# Patient Record
Sex: Male | Born: 1950 | Race: White | Hispanic: No | Marital: Married | Smoking: Former smoker
Health system: Southern US, Community
[De-identification: ages and names within clinical notes are randomized; demographics above are authoritative.]

## PROBLEM LIST (undated history)

## (undated) DIAGNOSIS — E785 Hyperlipidemia, unspecified: Secondary | ICD-10-CM

## (undated) DIAGNOSIS — I10 Essential (primary) hypertension: Secondary | ICD-10-CM

## (undated) HISTORY — PX: NM MYOCAR PERF WALL MOTION: HXRAD629

## (undated) HISTORY — DX: Essential (primary) hypertension: I10

## (undated) HISTORY — DX: Hyperlipidemia, unspecified: E78.5

---

## 2009-07-17 ENCOUNTER — Encounter: Admission: RE | Admit: 2009-07-17 | Discharge: 2009-07-17 | Payer: Self-pay | Admitting: Orthopedic Surgery

## 2010-10-10 ENCOUNTER — Ambulatory Visit (HOSPITAL_BASED_OUTPATIENT_CLINIC_OR_DEPARTMENT_OTHER)
Admission: RE | Admit: 2010-10-10 | Discharge: 2010-10-10 | Disposition: A | Discharge status: Home / Self Care | Payer: 59 | Source: Ambulatory Visit | Attending: Orthopedic Surgery | Admitting: Orthopedic Surgery

## 2010-10-10 DIAGNOSIS — I10 Essential (primary) hypertension: Secondary | ICD-10-CM | POA: Insufficient documentation

## 2010-10-10 DIAGNOSIS — Z01812 Encounter for preprocedural laboratory examination: Secondary | ICD-10-CM | POA: Insufficient documentation

## 2010-10-10 DIAGNOSIS — M23359 Other meniscus derangements, posterior horn of lateral meniscus, unspecified knee: Secondary | ICD-10-CM | POA: Insufficient documentation

## 2010-10-10 DIAGNOSIS — Z01810 Encounter for preprocedural cardiovascular examination: Secondary | ICD-10-CM | POA: Insufficient documentation

## 2010-10-10 DIAGNOSIS — M23329 Other meniscus derangements, posterior horn of medial meniscus, unspecified knee: Secondary | ICD-10-CM | POA: Insufficient documentation

## 2010-10-10 DIAGNOSIS — M2340 Loose body in knee, unspecified knee: Secondary | ICD-10-CM | POA: Insufficient documentation

## 2010-10-10 DIAGNOSIS — M2240 Chondromalacia patellae, unspecified knee: Secondary | ICD-10-CM | POA: Insufficient documentation

## 2010-10-17 NOTE — Op Note (Signed)
  NAME:  Anthony Orr, Anthony Orr NO.:  [id number]  MEDICAL RECORD NO.:  [id number]          PATIENT TYPE:  LOCATION:                                 FACILITY:  PHYSICIAN:  Loreta Ave, M.D.      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  PROCEDURE:  Right knee exam under anesthesia, arthroscopy, partial medial and lateral meniscectomy.  Tricompartmental chondroplasty, removal of chondral loose bodies.  SURGEON:  Loreta Ave, MD  ASSISTANT:  Genene Churn. Barry Dienes, [place]  ANESTHESIA:  General.  BLOOD LOSS:  Minimal.  SPECIMENS:  None.  COMPLICATIONS:  None.  DRESSING:  Soft compressive.  TOURNIQUET:  Not employed. PROCEDURE:  The patient brought to the operating room, placed on the operating table in supine position.  After adequate anesthesia had been obtained, leg holder applied.  Leg prepped and draped in usual sterile fashion.  Two portals, one each medial and lateral parapatellar. Arthroscope introduced.  Knee distended and inspected.  Good tracking. Some grade 2 and mild grade 3 changes peak patella treated with chondroplasty to a stable surface.  Trochlea looked good.  Cruciate ligaments intact.  Medial compartment diffuse grade 2 and 3 changes on the condyle debrided.  Marked complex tearing of the entire posterior half medial meniscus with development of small meniscal cyst on the back.  Posterior half removed tapered into remaining meniscus.  Anterior half looked good.  Laterally, there was a displaced tear off the posterior horn which was removed, tapered in smoothly.  He is still salvaging a fair amount of meniscus.  Some grade 2 changes on the plateau and  on the condyle debrided.  At completion, the entire knee examined, no other findings were appreciated.  Instruments and fluid removed.  Portals injected with Marcaine, closed with nylon.  Knee injected with Depo-Medrol and Marcaine.  Anesthesia reversed.  Brought to recovery room.  Tolerated surgery well.  No complications.     Loreta Ave, M.D.     DFM/MEDQ  D:  10/10/2010  T:  10/11/2010  Job:  [id number]  Electronically Signed by Mckinley Jewel M.D. on 10/17/2010 02:09:39 PM

## 2011-07-03 IMAGING — CT CT HEAD W/O CM
2 series · 16 of 30 positions shown, 18 images · non-contrast
Comparison: None.

CLINICAL DATA: 58-year-old male with headache status post head
trauma on 07/07/2009.

CT HEAD WITHOUT CONTRAST
TECHNIQUE: Contiguous axial images were obtained from the base of
the skull through the vertex without contrast.

[Series 2: head w/o · axial · non-contrast · 0.49mm/px · z∈[+25,+151]mm · 8 of 32 slices shown, 10 images]
[im 4/32  brain]
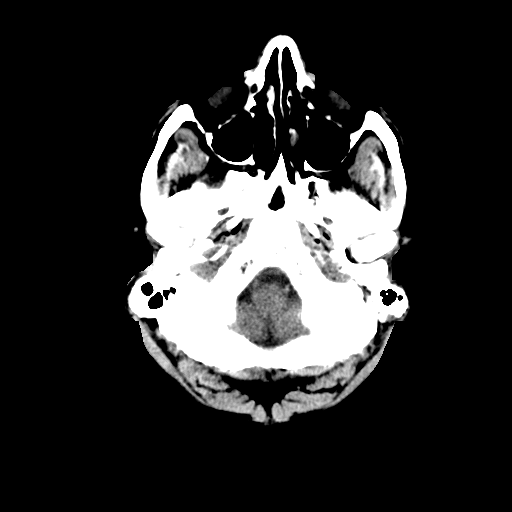
[im 4/32  bone]
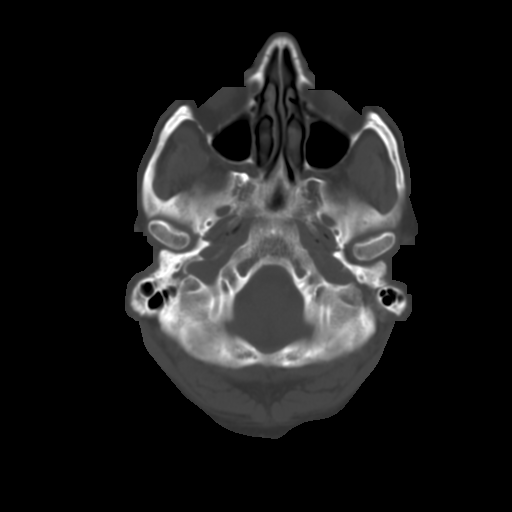
[im 7/32  brain]
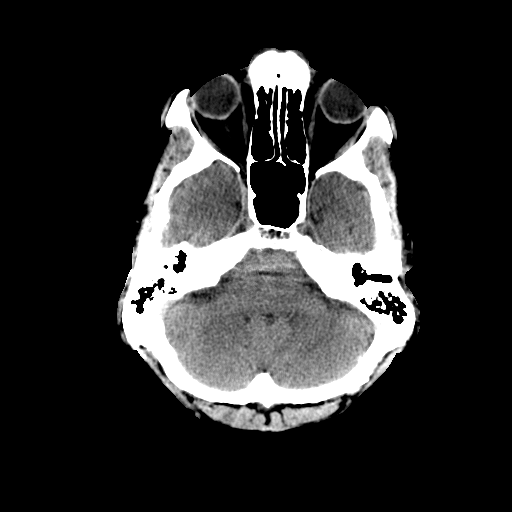
[im 11/32  brain]
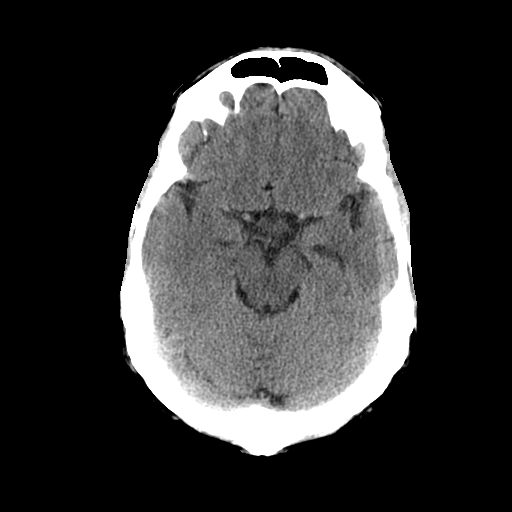
[im 14/32  brain]
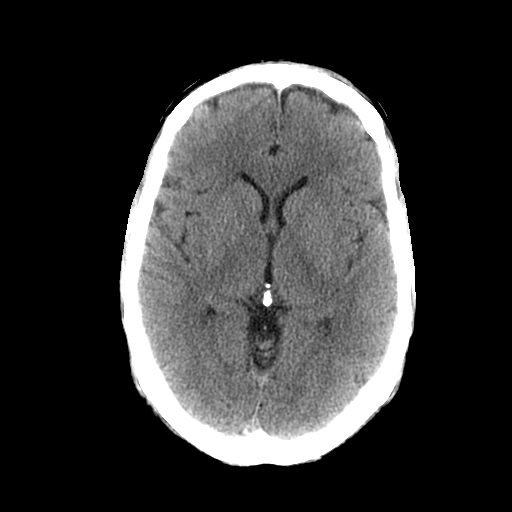
[im 18/32  brain]
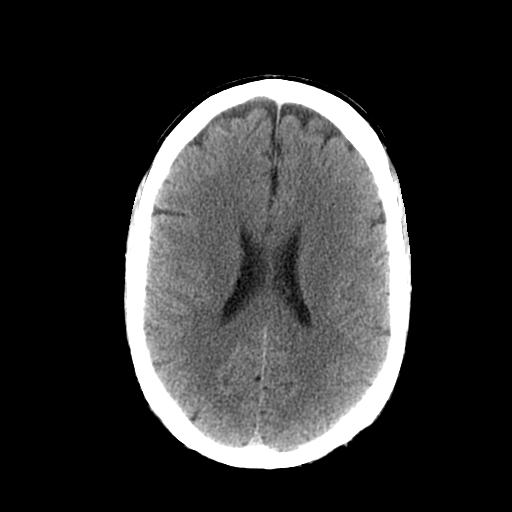
[im 18/32  bone]
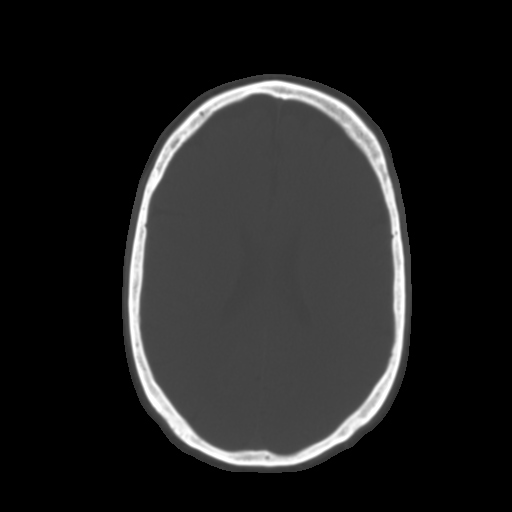
[im 21/32  brain]
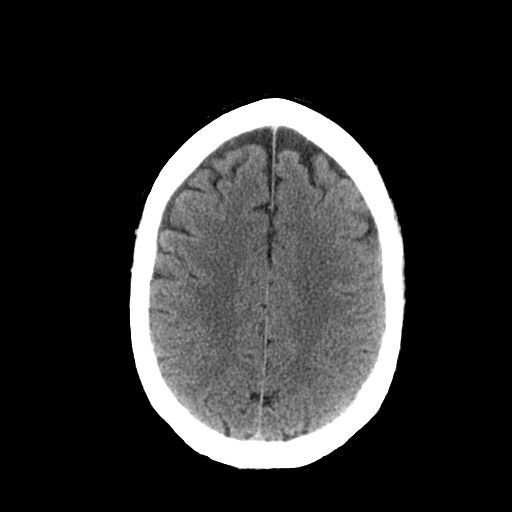
[im 25/32  brain]
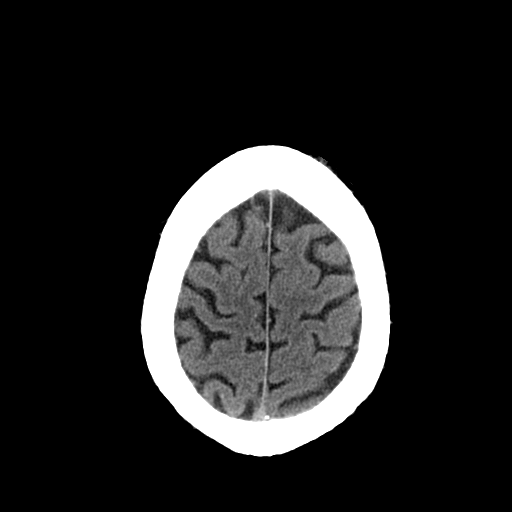
[im 28/32  brain]
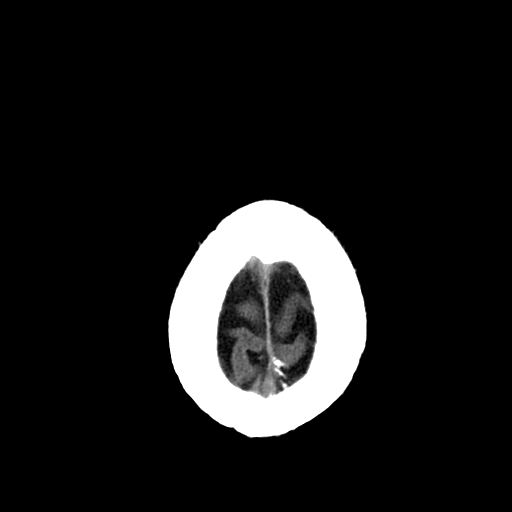

[Series 3: head bone · axial · 0.49mm/px · z∈[+24,+155]mm · 8 of 64 slices shown]
[im 7/64  bone]
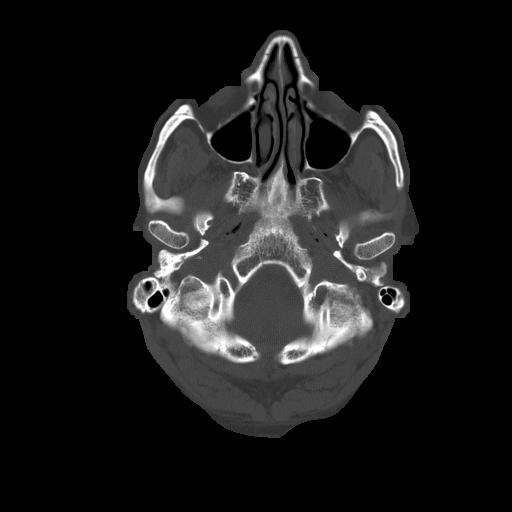
[im 14/64  bone]
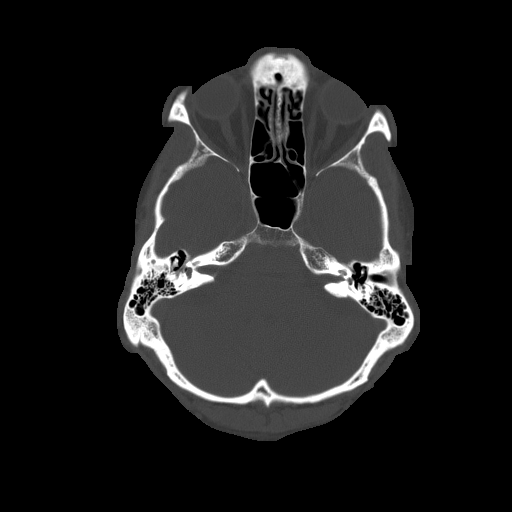
[im 20/64  bone]
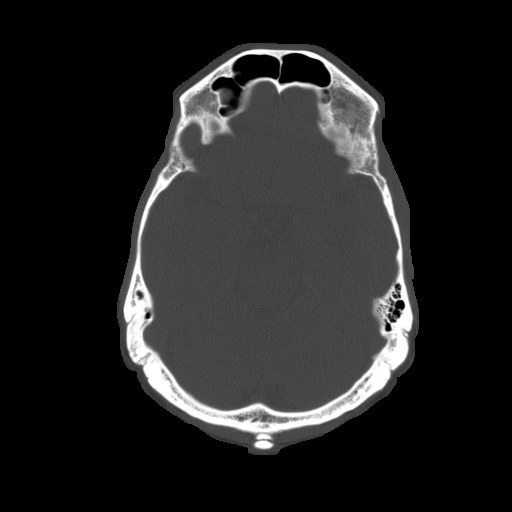
[im 27/64  bone]
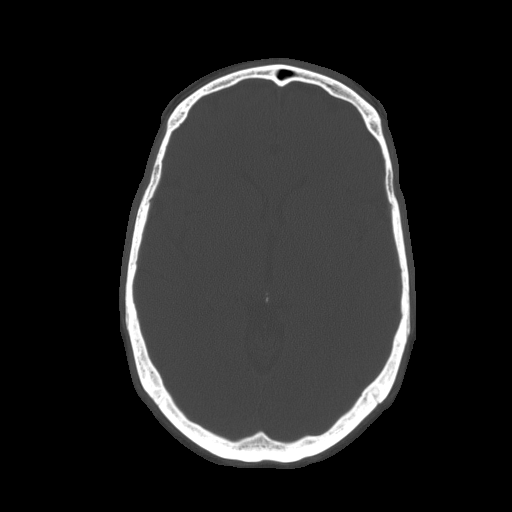
[im 37/64  bone]
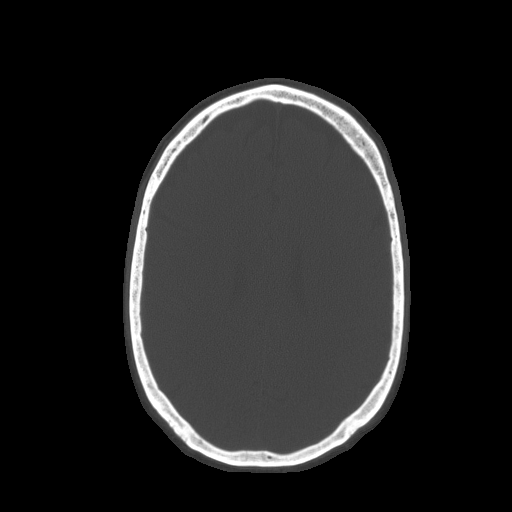
[im 44/64  bone]
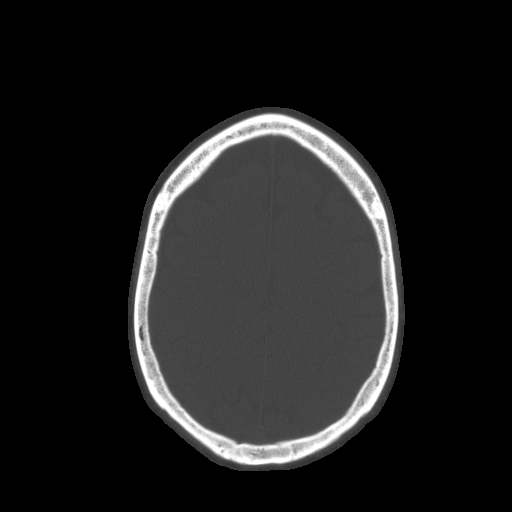
[im 50/64  bone]
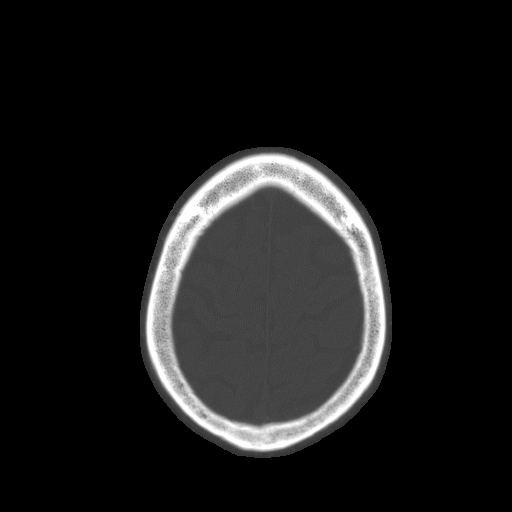
[im 57/64  bone]
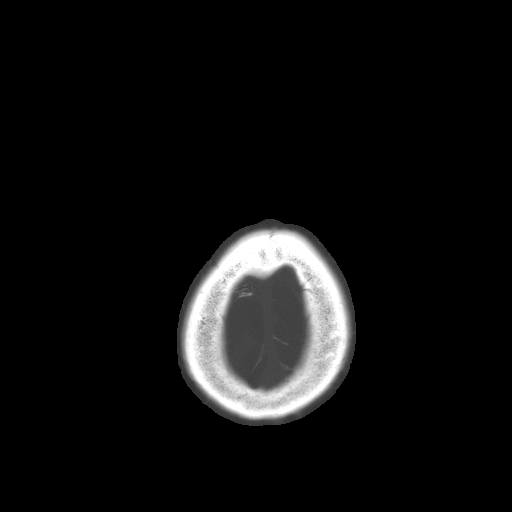

[16 of 30 positions shown; findings below may reference images not displayed]

FINDINGS: Minimal scalp irregularity anteriorly near the midline on
series 3 image [DATE] represent mild soft tissue traumatic
sequelae.  Otherwise, Visualized orbits and scalp soft tissues are
within normal limits.  No acute osseous abnormality identified.
Visualized paranasal sinuses and mastoids are clear.  Calcified
atherosclerosis at the skull base.

Cerebral volume is within normal limits for age.  No midline shift,
ventriculomegaly, mass effect, evidence of mass lesion,
intracranial hemorrhage or evidence of cortically based acute
infarction.  Gray-white matter differentiation is within normal
limits throughout the brain.  No suspicious intracranial vascular
hyperdensity.
IMPRESSION: 1. Normal noncontrast CT appearance of the brain for age.
2.  Minor anterior forehead soft tissue injury without underlying
fracture.

## 2013-01-06 ENCOUNTER — Other Ambulatory Visit: Payer: Self-pay

## 2013-01-06 MED ORDER — AMLODIPINE BESYLATE 5 MG PO TABS: 5.0000 mg | ORAL_TABLET | Freq: Every day | ORAL | Status: DC

## 2013-01-06 NOTE — Telephone Encounter (Signed)
Rx was sent to pharmacy electronically. 

## 2013-01-31 ENCOUNTER — Telehealth (HOSPITAL_COMMUNITY): Payer: Self-pay | Admitting: Cardiovascular Disease

## 2013-01-31 ENCOUNTER — Other Ambulatory Visit (HOSPITAL_COMMUNITY): Payer: Self-pay | Admitting: Cardiovascular Disease

## 2013-01-31 DIAGNOSIS — I10 Essential (primary) hypertension: Secondary | ICD-10-CM

## 2013-02-15 ENCOUNTER — Telehealth (HOSPITAL_COMMUNITY): Payer: Self-pay | Admitting: Cardiovascular Disease

## 2013-02-21 ENCOUNTER — Ambulatory Visit (INDEPENDENT_AMBULATORY_CARE_PROVIDER_SITE_OTHER): Payer: 59 | Admitting: Cardiovascular Disease

## 2013-02-21 ENCOUNTER — Encounter: Payer: Self-pay | Admitting: Cardiovascular Disease

## 2013-02-21 VITALS — BP 138/86 | HR 55 | Ht 68.0 in | Wt 191.3 lb

## 2013-02-21 DIAGNOSIS — E785 Hyperlipidemia, unspecified: Secondary | ICD-10-CM | POA: Insufficient documentation

## 2013-02-21 DIAGNOSIS — I10 Essential (primary) hypertension: Secondary | ICD-10-CM

## 2013-02-21 NOTE — Progress Notes (Signed)
Patient ID: Anthony Orr, male   DOB: 01/25/1951, 62 y.o.   MRN: [id number]     HPI: Anthony Orr, is a 61 y.o. male presents to the office today for cardiology evaluation.  Anthony Orr is now 62 years old. In 2002, he underwent a routine treadmill test and developed a short burst of wide complex tachycardia. Since that time, he has been on low dose beta blocker therapy without recurrence. He has a history of hypertension as well as hyperlipidemia. His last echo Doppler study in October 2008 showed normal systolic function but it was near mid cavity obliteration and there was a suggestion of a 10 mm pressure gradient.  Anthony Orr has remained active. He exercises fairly regularly. He skis in the winter. He denies any palpitations. We have discussed the possibility of getting an echo Doppler study this year. He apparently did not get this done. He denies shortness of breath. He denies change in exercise tolerance. He denies any chest pain per  No past medical history on file.  No past surgical history on file.  No Known Allergies  Current Outpatient Prescriptions  Medication Sig Dispense Refill  . aspirin 81 MG tablet Take 81 mg by mouth daily.      Marland Kitchen atorvastatin (LIPITOR) 40 MG tablet Take 40 mg by mouth daily.      . metoprolol succinate (TOPROL-XL) 25 MG 24 hr tablet Take 25 mg by mouth daily.      Marland Kitchen amLODipine (NORVASC) 5 MG tablet Take 1 tablet (5 mg total) by mouth daily.  30 tablet  8   No current facility-administered medications for this visit.    History   Social History  . Marital Status: Married    Spouse Name: N/A    Number of Children: N/A  . Years of Education: N/A   Occupational History  . Not on file.   Social History Main Topics  . Smoking status: Former Smoker    Types: Cigarettes  . Smokeless tobacco: Never Used     Comment: quit in 2007  . Alcohol Use: 7.0 oz/week    14 drink(s) per week  . Drug Use: Not on file  . Sexually Active: Not on  file   Other Topics Concern  . Not on file   Social History Narrative  . No narrative on file    Socially he is married and has 4 children and 6 grandchildren. He builds medical buildings. There is no tobacco use. He does drink occasional alcohol.  ROS is negative for fevers, chills or night sweats. He denies outpatient. There are no visual symptoms. He denies wheezing. He denies chest pain. He denies sleep disturbance. Denies abdominal pain per he denies myalgias or edema  Other system review is negative.  PE BP 138/86  Pulse 55  Ht 5\' 8"  (1.727 m)  Wt 191 lb 4.8 oz (86.773 kg)  BMI 29.09 kg/m2  General: Alert, oriented, no distress.  Skin: normal turgor, no rashes HEENT: Normocephalic, atraumatic. Pupils round and reactive; sclera anicteric;no lid lag.  Nose without nasal septal hypertrophy Mouth/Parynx benign; Mallinpatti scale 2 Neck: No JVD, no carotid briuts Lungs: clear to ausculatation and percussion; no wheezing or rales Heart: RRR, s1 s2 normal 1/6 systolic murmur. No S3 gallop. No S4 gallop. Abdomen: soft, nontender; no hepatosplenomehaly, BS+; abdominal aorta nontender and not dilated by palpation. Pulses 2+ Extremities: no clubbing cyanosis or edema, Homan's sign negative  Neurologic: grossly nonfocal  ECG: Sinus rhythm at 55 beats  per minute. PR interval 202 ms. No significant ST-T changes.  LABS:  BMET No results found for this basename: na, k, cl, co2, glucose, bun, creatinine, calcium, gfrnonaa, gfraa     Hepatic Function Panel  No results found for this basename: prot, albumin, ast, alt, alkphos, bilitot, bilidir, ibili     CBC    Component Value Date/Time   HGB 16.3 10/10/2010 0935     BNP No results found for this basename: probnp    Lipid Panel  No results found for this basename: chol, trig, hdl, cholhdl, vldl, ldlcalc     RADIOLOGY: No results found.    ASSESSMENT AND PLAN: Anthony Orr feels well. He does have a history of  hyperlipidemia. Prior to initiating statin therapy his total cholesterol was 228 and LDL cholesterol 177. His last assessment in January 2014 revealed total cholesterol 161 triglycerides 110 HDL 61 LDL 78. He is tolerating amlodipine as well as Toprol for blood pressure control he is asymptomatic. I recommended that in one year he undergo a followup echo Doppler study and I will see him in the office at that time. Prior to that evaluation will also undergo a complete set of laboratory.     Lennette Bihari, MD, [facility]  02/21/2013 2:46 PM

## 2013-02-21 NOTE — Patient Instructions (Addendum)
Your physician recommends that you return for lab work fasting in 1 year  Your physician recommends that you schedule a follow-up appointment in: 1 year

## 2013-02-22 ENCOUNTER — Encounter: Payer: Self-pay | Admitting: Cardiovascular Disease

## 2013-02-24 ENCOUNTER — Telehealth (HOSPITAL_COMMUNITY): Payer: Self-pay | Admitting: Cardiovascular Disease

## 2013-02-24 NOTE — Telephone Encounter (Signed)
Pt was returning my call regarding scheduling an echo that was ordered by Dr. Tresa Endo. Pt states that Dr. Tresa Endo told him that he doesn't need any testing until next year. Should I defer this order until then or schedule the echo now

## 2013-02-25 NOTE — Telephone Encounter (Signed)
Please address. Thanks!

## 2013-03-07 NOTE — Telephone Encounter (Signed)
Echo for next year

## 2013-03-07 NOTE — Telephone Encounter (Signed)
Anthony Orr just responded to this question. He was on vacation last week.

## 2013-04-21 ENCOUNTER — Telehealth (HOSPITAL_COMMUNITY): Payer: Self-pay | Admitting: *Deleted

## 2013-06-28 ENCOUNTER — Other Ambulatory Visit: Payer: Self-pay | Admitting: *Deleted

## 2013-06-28 MED ORDER — ATORVASTATIN CALCIUM 40 MG PO TABS: 40.0000 mg | ORAL_TABLET | Freq: Every day | ORAL | Status: DC

## 2013-07-21 DIAGNOSIS — K222 Esophageal obstruction: Secondary | ICD-10-CM | POA: Insufficient documentation

## 2013-07-21 DIAGNOSIS — K219 Gastro-esophageal reflux disease without esophagitis: Secondary | ICD-10-CM | POA: Insufficient documentation

## 2013-10-11 ENCOUNTER — Other Ambulatory Visit: Payer: Self-pay | Admitting: *Deleted

## 2013-10-11 MED ORDER — METOPROLOL SUCCINATE ER 25 MG PO TB24: 25.0000 mg | ORAL_TABLET | Freq: Every day | ORAL | Status: DC

## 2013-10-11 NOTE — Telephone Encounter (Signed)
Rx refill sent to patients pharmacy  

## 2014-01-24 ENCOUNTER — Other Ambulatory Visit: Payer: Self-pay

## 2014-01-24 MED ORDER — AMLODIPINE BESYLATE 5 MG PO TABS: 5.0000 mg | ORAL_TABLET | Freq: Every day | ORAL | Status: DC

## 2014-01-24 NOTE — Telephone Encounter (Signed)
Rx was sent to pharmacy electronically. 

## 2014-01-30 ENCOUNTER — Telehealth: Payer: Self-pay | Admitting: Cardiovascular Disease

## 2014-01-30 NOTE — Telephone Encounter (Signed)
Pt. Anthony SpanielSaid you were a good friend and wants you to call him about a letter for his insurance company, stating he is ok

## 2014-01-30 NOTE — Telephone Encounter (Signed)
Pt needs a letter for his insurance stating that he is all right.Please call him,he will give you more details.

## 2014-02-27 ENCOUNTER — Telehealth (HOSPITAL_COMMUNITY): Payer: Self-pay | Admitting: *Deleted

## 2014-03-02 ENCOUNTER — Telehealth (HOSPITAL_COMMUNITY): Payer: Self-pay | Admitting: *Deleted

## 2014-03-02 ENCOUNTER — Other Ambulatory Visit (HOSPITAL_COMMUNITY): Payer: Self-pay | Admitting: Cardiovascular Disease

## 2014-03-02 DIAGNOSIS — I10 Essential (primary) hypertension: Secondary | ICD-10-CM

## 2014-03-03 ENCOUNTER — Ambulatory Visit (HOSPITAL_COMMUNITY)
Admission: RE | Admit: 2014-03-03 | Discharge: 2014-03-03 | Disposition: A | Discharge status: Home / Self Care | Payer: Medicare Other | Source: Ambulatory Visit | Attending: Cardiovascular Disease | Admitting: Cardiovascular Disease

## 2014-03-03 DIAGNOSIS — I517 Cardiomegaly: Secondary | ICD-10-CM

## 2014-03-03 DIAGNOSIS — I10 Essential (primary) hypertension: Secondary | ICD-10-CM | POA: Insufficient documentation

## 2014-03-03 NOTE — Progress Notes (Signed)
2D Echocardiogram Complete.  03/03/2014   Tiye Huwe, RDCS  

## 2014-03-06 ENCOUNTER — Telehealth: Payer: Self-pay | Admitting: Cardiovascular Disease

## 2014-03-06 NOTE — Telephone Encounter (Signed)
Pt called in stating that he would not be able to make his appt on 08/26 and wants to know if he can be worked in that morning. Pt would like a call regardless to know if that can be arranged or not. Please call  Thanks

## 2014-03-06 NOTE — Telephone Encounter (Signed)
Please address this at you leisure

## 2014-03-08 ENCOUNTER — Ambulatory Visit: Payer: 59 | Admitting: Cardiovascular Disease

## 2014-03-13 NOTE — Telephone Encounter (Signed)
Closed encounter °

## 2014-03-14 ENCOUNTER — Encounter: Payer: Self-pay | Admitting: *Deleted

## 2014-04-17 ENCOUNTER — Encounter: Payer: Self-pay | Admitting: Podiatry

## 2014-04-17 ENCOUNTER — Ambulatory Visit (INDEPENDENT_AMBULATORY_CARE_PROVIDER_SITE_OTHER): Payer: Medicare Other | Admitting: Podiatry

## 2014-04-17 ENCOUNTER — Ambulatory Visit (INDEPENDENT_AMBULATORY_CARE_PROVIDER_SITE_OTHER): Payer: Medicare Other

## 2014-04-17 VITALS — BP 144/82 | HR 57 | Resp 16

## 2014-04-17 DIAGNOSIS — M205X1 Other deformities of toe(s) (acquired), right foot: Secondary | ICD-10-CM

## 2014-04-17 DIAGNOSIS — M79671 Pain in right foot: Secondary | ICD-10-CM | POA: Diagnosis not present

## 2014-04-17 NOTE — Progress Notes (Signed)
Subjective:     Patient ID: Anthony Orr, male   DOB: 1950/10/16, 63 y.o.   MRN: [id number]  HPI patient presents stating I'm getting more pain in my first metatarsal phalangeal joint right over the last year and I did have some numbness recently and I am ready to get it fixed   Review of Systems     Objective:   Physical Exam Neurovascular status intact with muscle strength adequate and range of motion subtalar midtarsal joint within normal limits. Patient's noted to have diminishment range of motion first MPJ bilateral but there is approximate 20 of motion that is pain-free with no crepitus. 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 We discussed at great length surgery and I did explain that this can take upwards of 6 months to get better but hopefully in 3 months he should be able to return to active activities. I discussed this with and he wants surgery and wants to get the right foot done and I did explain the difference between trying to repair the joint versus fusion versus joint implantation procedure he is opted for repairing the joint and I'm very hopeful that this will solve his problem even though possibility existed will require ultimate fusion or joint implantation. Today I allowed him to read consent form for a biplanar osteotomy first metatarsal right with possible subchondral bone drilling and bone spur removal. Patient read the consent form and signed after reviewing all complications and wants surgery understanding total recovery. He approximate 6 months. Dispensed air fracture walker with all instructions on usage and I wanted getting used to that prior to surgery

## 2014-04-20 ENCOUNTER — Other Ambulatory Visit: Payer: Self-pay | Admitting: *Deleted

## 2014-04-20 MED ORDER — AMLODIPINE BESYLATE 5 MG PO TABS: 5.0000 mg | ORAL_TABLET | Freq: Every day | ORAL | Status: DC

## 2014-04-20 MED ORDER — ATORVASTATIN CALCIUM 40 MG PO TABS
40.0000 mg | ORAL_TABLET | Freq: Every day | ORAL | Status: DC
Start: 2014-04-20 — End: 2014-05-10

## 2014-04-20 NOTE — Telephone Encounter (Signed)
Medication refilled electronmically with notation: Pt must keep appointment for future refills.

## 2014-05-01 ENCOUNTER — Telehealth: Payer: Self-pay | Admitting: *Deleted

## 2014-05-01 NOTE — Telephone Encounter (Signed)
I'm scheduled to have toe surgery tomorrow.  I need to talk to Dr. Charlsie Merlesegal, he knows me, it's Audry RilesJim Whetzel.  I'm a little concerned about it.

## 2014-05-08 ENCOUNTER — Other Ambulatory Visit: Payer: Self-pay | Admitting: *Deleted

## 2014-05-08 ENCOUNTER — Encounter: Payer: Medicare Other | Admitting: Podiatry

## 2014-05-08 MED ORDER — METOPROLOL SUCCINATE ER 25 MG PO TB24: 25.0000 mg | ORAL_TABLET | Freq: Every day | ORAL | Status: DC

## 2014-05-08 NOTE — Telephone Encounter (Signed)
Refilled electronically 

## 2014-05-10 ENCOUNTER — Encounter: Payer: Self-pay | Admitting: Cardiovascular Disease

## 2014-05-10 ENCOUNTER — Ambulatory Visit (INDEPENDENT_AMBULATORY_CARE_PROVIDER_SITE_OTHER): Payer: Medicare Other | Admitting: Cardiovascular Disease

## 2014-05-10 VITALS — BP 128/82 | HR 62 | Ht 68.0 in | Wt 195.0 lb

## 2014-05-10 DIAGNOSIS — I10 Essential (primary) hypertension: Secondary | ICD-10-CM

## 2014-05-10 DIAGNOSIS — E785 Hyperlipidemia, unspecified: Secondary | ICD-10-CM

## 2014-05-10 DIAGNOSIS — Z125 Encounter for screening for malignant neoplasm of prostate: Secondary | ICD-10-CM

## 2014-05-10 DIAGNOSIS — E782 Mixed hyperlipidemia: Secondary | ICD-10-CM

## 2014-05-10 MED ORDER — METOPROLOL SUCCINATE ER 25 MG PO TB24: 25.0000 mg | ORAL_TABLET | Freq: Every day | ORAL | Status: DC

## 2014-05-10 NOTE — Progress Notes (Signed)
Patient ID: Anthony Orr, male   DOB: March 06, 1951, 63 y.o.   MRN: [id number]     HPI: Anthony BeneJames F Wuthrich is a 63 y.o. male presents to the office today for a 4814 month cardiology evaluation.   In 2002, Mr. Gaynell FaceMarshall underwent a routine treadmill test and developed a short burst of wide complex tachycardia. Since that time, he has been on low dose beta blocker therapy without recurrence. He has a history of hypertension as well as hyperlipidemia. An echo Doppler study in October 2008 showed normal systolic function but suggested mild mid cavity obliteration with a 10 mm pressure gradient.  Mr. Gaynell FaceMarshall has remained active.  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 He skis in the winter. He denies any palpitations.  He states since January, he has been trying to avoid red meat.  He underwent a six-year follow-up echo Doppler study on 03/03/2014.  This showed mild LVH with normal systolic function with an ejection fraction of 55-60%.  He did not have regional wall motion abnormalities.  There was no mention of any mid cavity obstruction or gradient.  There was suggestion of mild grade 1 diastolic relaxation abnormality.  He had mild left atrial dilatation.  He denies chest pain.  He denies palpitations.  He denies change in exercise tolerance.  History reviewed. No pertinent past medical history.  History reviewed. No pertinent past surgical history.  No Known Allergies  Current Outpatient Prescriptions  Medication Sig Dispense Refill  . amLODipine (NORVASC) 5 MG tablet Take 1 tablet (5 mg total) by mouth daily. Pt must keep appointment for future refills.  30 tablet  0  . aspirin 81 MG tablet Take 81 mg by mouth daily.      Marland Kitchen. atorvastatin (LIPITOR) 40 MG tablet Take 1 tablet (40 mg total) by mouth daily. Pt must keep appointment for future refills.  30 tablet  0  . metoprolol succinate (TOPROL-XL) 25 MG 24 hr tablet Take 1 tablet (25 mg total) by mouth daily.   30 tablet  4   No current facility-administered medications for this visit.    History   Social History  . Marital Status: Married    Spouse Name: N/A    Number of Children: N/A  . Years of Education: N/A   Occupational History  . Not on file.   Social History Main Topics  . Smoking status: Former Smoker    Types: Cigarettes  . Smokeless tobacco: Never Used     Comment: quit in 2007  . Alcohol Use: 7.0 oz/week    14 drink(s) per week  . Drug Use: Not on file  . Sexual Activity: Not on file   Other Topics Concern  . Not on file   Social History Narrative  . No narrative on file    Socially he is married and has 4 children and 6 grandchildren. He builds medical buildings. There is no tobacco use. He does drink occasional alcohol.  ROS General: Negative; No fevers, chills, or night sweats;  HEENT: Negative; No changes in vision or hearing, sinus congestion, difficulty swallowing Pulmonary: Negative; No cough, wheezing, shortness of breath, hemoptysis Cardiovascular: Negative; No chest pain, presyncope, syncope, palpitations GI: Negative; No nausea, vomiting, diarrhea, or abdominal pain GU: Negative; No dysuria, hematuria, or difficulty voiding Musculoskeletal: Negative; no myalgias, joint pain, or weakness Hematologic/Oncology: Negative; no easy bruising, bleeding Endocrine: Negative; no heat/cold intolerance; no diabetes Neuro: Negative; no changes in balance, headaches Skin: Negative; No rashes  or skin lesions Psychiatric: Negative; No behavioral problems, depression Sleep: Negative; No snoring, daytime sleepiness, hypersomnolence, bruxism, restless legs, hypnogognic hallucinations, no cataplexy Other comprehensive 14 point system review is negative.   PE BP 128/82  Pulse 62  Ht 5\' 8"  (1.727 m)  Wt 195 lb (88.451 kg)  BMI 29.66 kg/m2  General: Alert, oriented, no distress.  Skin: normal turgor, no rashes HEENT: Normocephalic, atraumatic. Pupils round and  reactive; sclera anicteric;no lid lag.  Nose without nasal septal hypertrophy Mouth/Parynx benign; Mallinpatti scale 2 Neck: No JVD, no carotid bruits with normal carotid upstroke Lungs: clear to ausculatation and percussion; no wheezing or rales Chest wall: Nontender to palpation Heart: RRR, s1 s2 normal 1/6 systolic murmur. No diastolic murmur No S3 gallop. No S4 gallop.  No rubs, thrills or heaves Abdomen: soft, nontender; no hepatosplenomehaly, BS+; abdominal aorta nontender and not dilated by palpation. Back: No CVA tenderness Pulses 2+ Extremities: no clubbing cyanosis or edema, Homan's sign negative  Neurologic: grossly nonfocal Psychological: Normal affect and mood  ECG (independently read by me): Normal sinus rhythm at 62 bpm.  Mild first-degree AV block with a PR interval 21 6/92.  Normal QTc interval at 406 ms.    August 2014ECG: Sinus rhythm at 55 beats per minute. PR interval 202 ms. No significant ST-T changes.  LABS:  BMET No results found for this basename: na,  k,  cl,  co2,  glucose,  bun,  creatinine,  calcium,  gfrnonaa,  gfraa     Hepatic Function Panel  No results found for this basename: prot,  albumin,  ast,  alt,  alkphos,  bilitot,  bilidir,  ibili     CBC    Component Value Date/Time   HGB 16.3 10/10/2010 0935     BNP No results found for this basename: probnp    Lipid Panel  No results found for this basename: chol,  trig,  hdl,  cholhdl,  vldl,  ldlcalc     RADIOLOGY: No results found.    ASSESSMENT AND PLAN: Mr. Gaynell FaceMarshall is now 63 years old.  He has a history of hypertension which has been controlled with amlodipine 5 mg in addition to Toprol-XL 25 mg.  In 2002, he was found to have a burst of nonsustained ventricular tachycardia on routine treadmill testing.  He has tolerated low-dose beta blocker with any episodes of palpitations or recurrent documented arrhythmia. Prior to initiating statin therapy his total cholesterol was 228 and  LDL cholesterol 177. His last assessment in January 2014 revealed total cholesterol 161 triglycerides 110 HDL 61 LDL 78.  Viewed his echo Doppler study, which shows normal systolic function with a suggestion of grade 1 diastolic dysfunction.  On this present echo.  There was no mention of any mid intraventricular cavity gradient.  He is remaining asymptomatic on current therapy.  He has not had bovine meat products since January.  We will check a complete set of blood work in the fasting state.  I will contact him regarding these results and changes will be made if necessary.  Otherwise, I will see him in one year for reevaluation.    Lennette Biharihomas A. Irven Ingalsbe, MD, [facility]  05/10/2014 12:32 PM

## 2014-05-10 NOTE — Patient Instructions (Signed)
Your physician recommends that you return for lab work fasting.  Your physician wants you to follow-up in: 1 year or sooner if needed with Dr. Kelly. You will receive a reminder letter in the mail two months in advance. If you don't receive a letter, please call our office to schedule the follow-up appointment. 

## 2014-05-13 ENCOUNTER — Encounter: Payer: Self-pay | Admitting: Cardiovascular Disease

## 2014-05-18 LAB — CBC
HEMATOCRIT: 46.7 % (ref 39.0–52.0)
Hemoglobin: 15.7 g/dL (ref 13.0–17.0)
MCH: 31.5 pg (ref 26.0–34.0)
MCHC: 33.6 g/dL (ref 30.0–36.0)
MCV: 93.8 fL (ref 78.0–100.0)
Platelets: 192 10*3/uL (ref 150–400)
RBC: 4.98 MIL/uL (ref 4.22–5.81)
RDW: 14 % (ref 11.5–15.5)
WBC: 6.6 10*3/uL (ref 4.0–10.5)

## 2014-05-18 LAB — TSH: TSH: 1.158 u[IU]/mL (ref 0.350–4.500)

## 2014-05-18 LAB — PSA: PSA: 1.39 ng/mL (ref <=4.00)

## 2014-05-18 LAB — COMPREHENSIVE METABOLIC PANEL
ALT: 29 U/L (ref 0–53)
AST: 25 U/L (ref 0–37)
Albumin: 4.5 g/dL (ref 3.5–5.2)
Alkaline Phosphatase: 51 U/L (ref 39–117)
BILIRUBIN TOTAL: 0.7 mg/dL (ref 0.2–1.2)
BUN: 13 mg/dL (ref 6–23)
CO2: 29 mEq/L (ref 19–32)
CREATININE: 0.97 mg/dL (ref 0.50–1.35)
Calcium: 9.6 mg/dL (ref 8.4–10.5)
Chloride: 102 mEq/L (ref 96–112)
GLUCOSE: 91 mg/dL (ref 70–99)
POTASSIUM: 4.9 meq/L (ref 3.5–5.3)
SODIUM: 139 meq/L (ref 135–145)
Total Protein: 6.9 g/dL (ref 6.0–8.3)

## 2014-05-18 LAB — LIPID PANEL
Cholesterol: 152 mg/dL (ref 0–200)
HDL: 60 mg/dL (ref >39)
LDL Cholesterol: 75 mg/dL (ref 0–99)
Total CHOL/HDL Ratio: 2.5 Ratio
Triglycerides: 84 mg/dL (ref <150)
VLDL: 17 mg/dL (ref 0–40)

## 2014-05-30 ENCOUNTER — Other Ambulatory Visit: Payer: Self-pay

## 2014-05-30 MED ORDER — AMLODIPINE BESYLATE 5 MG PO TABS: 5.0000 mg | ORAL_TABLET | Freq: Every day | ORAL | Status: DC

## 2014-05-30 MED ORDER — ATORVASTATIN CALCIUM 40 MG PO TABS: 40.0000 mg | ORAL_TABLET | Freq: Every day | ORAL | Status: DC

## 2014-05-30 NOTE — Telephone Encounter (Signed)
Rx sent to pharmacy   

## 2014-06-01 ENCOUNTER — Telehealth: Payer: Self-pay | Admitting: *Deleted

## 2014-06-01 ENCOUNTER — Encounter: Payer: Self-pay | Admitting: *Deleted

## 2014-06-01 NOTE — Telephone Encounter (Signed)
Left message on cell number labs reviewed by Dr.Kelly and were great. I will send him a copy for his review. Call back if questions or concerns.

## 2014-06-01 NOTE — Telephone Encounter (Signed)
-----   Message from Lennette Biharihomas A Kelly, MD sent at 05/28/2014  9:10 PM EST ----- Labs great

## 2015-01-18 ENCOUNTER — Encounter: Payer: Self-pay | Admitting: *Deleted

## 2015-01-25 ENCOUNTER — Encounter: Payer: Self-pay | Admitting: Cardiovascular Disease

## 2015-03-15 DEATH — deceased

## 2015-03-22 ENCOUNTER — Telehealth: Payer: Self-pay | Admitting: Cardiovascular Disease

## 2015-03-23 NOTE — Telephone Encounter (Signed)
Closed encounter °

## 2015-05-17 ENCOUNTER — Ambulatory Visit (INDEPENDENT_AMBULATORY_CARE_PROVIDER_SITE_OTHER): Payer: Medicare Other | Admitting: Cardiovascular Disease

## 2015-05-17 VITALS — BP 142/86 | HR 53 | Ht 68.0 in | Wt 195.2 lb

## 2015-05-17 DIAGNOSIS — E785 Hyperlipidemia, unspecified: Secondary | ICD-10-CM

## 2015-05-17 DIAGNOSIS — I10 Essential (primary) hypertension: Secondary | ICD-10-CM

## 2015-05-17 DIAGNOSIS — Z139 Encounter for screening, unspecified: Secondary | ICD-10-CM

## 2015-05-17 NOTE — Patient Instructions (Signed)
Your physician recommends that you return for lab work fasting.   Your physician wants you to follow-up in: 1 year or sooner if needed. You will receive a reminder letter in the mail two months in advance. If you don't receive a letter, please call our office to schedule the follow-up appointment.   

## 2015-05-18 ENCOUNTER — Encounter: Payer: Self-pay | Admitting: Cardiovascular Disease

## 2015-05-18 NOTE — Progress Notes (Signed)
Patient ID: Anthony Orr, male   DOB: 01/09/1951, 64 y.o.   MRN: [id number]     HPI: Anthony Orr is a 64 y.o. male presents to the office today for a one-year cardiology evaluation.  In 2002, Anthony Orr underwent a routine treadmill test and developed a short burst of wide complex tachycardia. Since that time, he has been on low dose beta blocker therapy without recurrence. He has a history of hypertension as well as hyperlipidemia. An echo Doppler study in October 2008 showed normal systolic function but suggested mild mid cavity obliteration with a 10 mm pressure gradient.  Anthony Orr has remained active.  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 He skis in the winter. He denies any palpitations.  A six-year follow-up echo Doppler study on 03/03/2014 showed mild LVH with normal systolic function with an ejection fraction of 55-60%.  He did not have regional wall motion abnormalities.  There was no mention of any mid cavity obstruction or gradient.  There was suggestion of mild grade 1 diastolic relaxation abnormality.  He had mild left atrial dilatation.  Over the past year, he has continued to do well.  However, he had an episode of pneumonia last year.  He is asymptomatic.  He has not had recent blood work.  He tells me [place].  He denies chest pain.  He denies palpitations.  He denies change in exercise tolerance. He presents for evaluation  Past Medical History  Diagnosis Date  . Hypertension   . Dyslipidemia     Past Surgical History  Procedure Laterality Date  . Nm myocar perf wall motion  [id number]    negative    No Known Allergies  Current Outpatient Prescriptions  Medication Sig Dispense Refill  . amLODipine (NORVASC) 5 MG tablet Take 1 tablet (5 mg total) by mouth daily. 30 tablet 9  . aspirin 81 MG tablet Take 81 mg by mouth daily.    Marland Kitchen atorvastatin (LIPITOR) 40 MG tablet Take 1  tablet (40 mg total) by mouth daily. 30 tablet 9  . metoprolol succinate (TOPROL-XL) 25 MG 24 hr tablet Take 1 tablet (25 mg total) by mouth daily. 30 tablet 11   No current facility-administered medications for this visit.    Social History   Social History  . Marital Status: Married    Spouse Name: N/A  . Number of Children: N/A  . Years of Education: N/A   Occupational History  . Not on file.   Social History Main Topics  . Smoking status: Former Smoker    Types: Cigarettes  . Smokeless tobacco: Never Used     Comment: quit in 2007  . Alcohol Use: 8.4 oz/week    14 Standard drinks or equivalent per week  . Drug Use: No  . Sexual Activity: Not on file   Other Topics Concern  . Not on file   Social History Narrative    Socially he is married and has 4 children and soon-to-be 8 grandchildren. He builds medical buildings. There is no tobacco use. He does drink occasional alcohol.  Family history is notable in that his father is deceased secondary to cancer.  ROS General: Negative; No fevers, chills, or night sweats;  HEENT: Negative; No changes in vision or hearing, sinus congestion, difficulty swallowing Pulmonary: Negative; No cough, wheezing, shortness of breath, hemoptysis Cardiovascular: Negative; No chest pain, presyncope, syncope, palpitations GI: Negative; No  nausea, vomiting, diarrhea, or abdominal pain GU: Negative; No dysuria, hematuria, or difficulty voiding Musculoskeletal: Negative; no myalgias, joint pain, or weakness Hematologic/Oncology: Negative; no easy bruising, bleeding Endocrine: Negative; no heat/cold intolerance; no diabetes Neuro: Negative; no changes in balance, headaches Skin: Negative; No rashes or skin lesions Psychiatric: Negative; No behavioral problems, depression Sleep: Negative; No snoring, daytime sleepiness, hypersomnolence, bruxism, restless legs, hypnogognic hallucinations, no cataplexy Other comprehensive 14 point system review  is negative.   PE BP 142/86 mmHg  Pulse 53  Ht _0  (1.727 m)  Wt 195 lb 3.2 oz (88.542 kg)  BMI 29.69 kg/m2  General: Alert, oriented, no distress.  Skin: normal turgor, no rashes HEENT: Normocephalic, atraumatic. Pupils round and reactive; sclera anicteric;no lid lag.  Nose without nasal septal hypertrophy Mouth/Parynx benign; Mallinpatti scale 2 Neck: No JVD, no carotid bruits with normal carotid upstroke Lungs: clear to ausculatation and percussion; no wheezing or rales Chest wall: Nontender to palpation Heart: RRR, s1 s2 normal 1/6 systolic murmur. No diastolic murmur No S3 gallop. No S4 gallop.  No rubs, thrills or heaves Abdomen: soft, nontender; no hepatosplenomehaly, BS+; abdominal aorta nontender and not dilated by palpation. Back: No CVA tenderness Pulses 2+ Extremities: no clubbing cyanosis or edema, Homan's sign negative  Neurologic: grossly nonfocal Psychological: Normal affect and mood  ECG (independently read by me): Sinus bradycardia 53 bpm.  First-degree AV block.  No ST segment changes.  QTc interval normal at 382 ms.  October 2015 ECG (independently read by me): Normal sinus rhythm at 62 bpm.  Mild first-degree AV block with a PR interval 21 6/92.  Normal QTc interval at 406 ms.   August 2014ECG: Sinus rhythm at 55 beats per minute. PR interval 202 ms. No significant ST-T changes.  LABS: BMET    Component Value Date/Time   NA 139 05/17/2014 1259   K 4.9 05/17/2014 1259   CL 102 05/17/2014 1259   CO2 29 05/17/2014 1259   GLUCOSE 91 05/17/2014 1259   BUN 13 05/17/2014 1259   CREATININE 0.97 05/17/2014 1259   CALCIUM 9.6 05/17/2014 1259   Hepatic Function Latest Ref Rng 05/17/2014  Total Protein 6.0 - 8.3 g/dL 6.9  Albumin 3.5 - 5.2 g/dL 4.5  AST 0 - 37 U/L 25  ALT 0 - 53 U/L 29  Alk Phosphatase 39 - 117 U/L 51  Total Bilirubin 0.2 - 1.2 mg/dL 0.7   CBC Latest Ref Rng 05/17/2014 10/10/2010  WBC 4.0 - 10.5 K/uL 6.6 -  Hemoglobin 13.0 - 17.0 g/dL  15.7 16.3  Hematocrit 39.0 - 52.0 % 46.7 -  Platelets 150 - 400 K/uL 192 -   Lab Results  Component Value Date   MCV 93.8 05/17/2014   Lab Results  Component Value Date   TSH 1.158 05/17/2014  No results found for: HGBA1C  Lipid Panel     Component Value Date/Time   CHOL 152 05/17/2014 1259   TRIG 84 05/17/2014 1259   HDL 60 05/17/2014 1259   CHOLHDL 2.5 05/17/2014 1259   VLDL 17 05/17/2014 1259   LDLCALC 75 05/17/2014 1259    RADIOLOGY: No results found.    ASSESSMENT AND PLAN: Anthony Orr is 64 year old white male who has a history of hypertension which has been controlled with amlodipine 5 mg in addition to Toprol-XL 25 mg.  In 2002, he was found to have a burst of nonsustained ventricular tachycardia on routine treadmill testing.  He has tolerated low-dose beta blocker with any episodes of palpitations  or recurrent documented arrhythmia. Prior to initiating statin therapy his total cholesterol was 228 and LDL cholesterol 177.  He has not had recent lab work but his lipid studies. one year ago were excellent.  His last echo Doppler study showed normal systolic function with a suggestion of grade 1 diastolic dysfunction.  There was no mention of any mid intraventricular cavity gradient.  He is remaining asymptomatic on current therapy.  I am recommending that laboratory be obtained in the fasting state.  I will contact him with results and adjustments need to his may be made to his medical regimen.  Otherwise, I will see him in one year for reevaluation.   Troy Sine, MD, [facility]  05/18/2015 6:24 PM

## 2015-05-31 ENCOUNTER — Telehealth: Payer: Self-pay | Admitting: Cardiovascular Disease

## 2015-05-31 NOTE — Telephone Encounter (Signed)
Pt is currently in the lab requesting to be tested for Hep C. Please assist   Thanks

## 2015-06-01 LAB — CBC
HEMATOCRIT: 43.5 % (ref 39.0–52.0)
HEMOGLOBIN: 14.9 g/dL (ref 13.0–17.0)
MCH: 31.6 pg (ref 26.0–34.0)
MCHC: 34.3 g/dL (ref 30.0–36.0)
MCV: 92.4 fL (ref 78.0–100.0)
MPV: 10.5 fL (ref 8.6–12.4)
Platelets: 164 10*3/uL (ref 150–400)
RBC: 4.71 MIL/uL (ref 4.22–5.81)
RDW: 14.6 % (ref 11.5–15.5)
WBC: 5.4 10*3/uL (ref 4.0–10.5)

## 2015-06-01 LAB — COMPREHENSIVE METABOLIC PANEL
ALBUMIN: 4.2 g/dL (ref 3.6–5.1)
ALT: 31 U/L (ref 9–46)
AST: 25 U/L (ref 10–35)
Alkaline Phosphatase: 53 U/L (ref 40–115)
BILIRUBIN TOTAL: 0.7 mg/dL (ref 0.2–1.2)
BUN: 13 mg/dL (ref 7–25)
CALCIUM: 9.1 mg/dL (ref 8.6–10.3)
CHLORIDE: 104 mmol/L (ref 98–110)
CO2: 23 mmol/L (ref 20–31)
CREATININE: 0.93 mg/dL (ref 0.70–1.25)
Glucose, Bld: 81 mg/dL (ref 65–99)
Potassium: 4.4 mmol/L (ref 3.5–5.3)
SODIUM: 141 mmol/L (ref 135–146)
TOTAL PROTEIN: 6.5 g/dL (ref 6.1–8.1)

## 2015-06-01 LAB — TSH: TSH: 1.171 u[IU]/mL (ref 0.350–4.500)

## 2015-06-01 LAB — LIPID PANEL
CHOLESTEROL: 148 mg/dL (ref 125–200)
HDL: 55 mg/dL (ref >=40)
LDL Cholesterol: 75 mg/dL (ref <130)
TRIGLYCERIDES: 89 mg/dL (ref <150)
Total CHOL/HDL Ratio: 2.7 Ratio (ref <=5.0)
VLDL: 18 mg/dL (ref <30)

## 2015-06-01 LAB — PSA: PSA: 0.97 ng/mL (ref <=4.00)

## 2015-06-01 NOTE — Telephone Encounter (Signed)
Informed lab that I don't have a diagnosis or documentation to justify the test. Patient will need to contact his PCP.

## 2015-06-04 ENCOUNTER — Telehealth: Payer: Self-pay | Admitting: Cardiovascular Disease

## 2015-06-04 NOTE — Telephone Encounter (Signed)
Returned call to patient no answer.Left message on personal voice mail call PCP for Hep C authorization.

## 2015-06-04 NOTE — Telephone Encounter (Signed)
Mr. Trudie BucklerMarshal is calling because he is needing authorization for a Hep C test . Please call .Marland Kitchen.  Thanks

## 2015-06-05 ENCOUNTER — Other Ambulatory Visit: Payer: Self-pay | Admitting: Cardiovascular Disease

## 2015-06-11 ENCOUNTER — Encounter: Payer: Self-pay | Admitting: *Deleted

## 2015-06-29 ENCOUNTER — Other Ambulatory Visit: Payer: Self-pay | Admitting: Cardiovascular Disease

## 2015-10-11 ENCOUNTER — Telehealth: Payer: Self-pay | Admitting: Cardiovascular Disease

## 2015-10-11 NOTE — Telephone Encounter (Signed)
Returned call. Patient is not having any active concerns. He is calling bc he wants his wife, Anthony Orr, to be seen by Dr. Tresa EndoKelly as new patient. She is not a patient in our system.  She has no health history - he notes her BP has been running high for past 2-3 weeks though, and is concerned about this. Does not identify other concerns. She has not seen a PCP. Gave patient recommendation to have her establish care w/ primary care physician. He requested that I speak to Dr. Tresa EndoKelly or Burna MortimerWanda about this or that he would reach out to Dr. Tresa EndoKelly.  Will route msg.

## 2015-10-11 NOTE — Telephone Encounter (Signed)
I know them well;  OK for me to see Mrs Anthony Orr in next 2 weeks; if she was offered 10/16/15 ok with me.

## 2015-10-11 NOTE — Telephone Encounter (Signed)
New message  Pt called. Req a call back to discuss scheduling an appt with Dr. Tresa EndoKelly. I offered him 10/16/2015 at 3:45p or 4p. Pt declined and req to speak with the nurse to schedule. Please call

## 2015-10-11 NOTE — Telephone Encounter (Signed)
Returned call  - mrs. Gaynell FaceMarshall OK w/ appt time and will be here next week.

## 2016-06-17 ENCOUNTER — Encounter: Payer: Self-pay | Admitting: Cardiovascular Disease

## 2016-06-17 ENCOUNTER — Ambulatory Visit (INDEPENDENT_AMBULATORY_CARE_PROVIDER_SITE_OTHER): Payer: 59 | Admitting: Cardiovascular Disease

## 2016-06-17 VITALS — BP 147/83 | HR 57 | Ht 68.0 in | Wt 197.4 lb

## 2016-06-17 DIAGNOSIS — Z79899 Other long term (current) drug therapy: Secondary | ICD-10-CM

## 2016-06-17 DIAGNOSIS — I10 Essential (primary) hypertension: Secondary | ICD-10-CM

## 2016-06-17 DIAGNOSIS — E785 Hyperlipidemia, unspecified: Secondary | ICD-10-CM | POA: Diagnosis not present

## 2016-06-17 MED ORDER — AMLODIPINE BESYLATE 5 MG PO TABS: 5.0000 mg | ORAL_TABLET | Freq: Every day | ORAL | 10 refills | Status: DC

## 2016-06-17 MED ORDER — METOPROLOL SUCCINATE ER 25 MG PO TB24: 25.0000 mg | ORAL_TABLET | Freq: Every day | ORAL | 11 refills | Status: DC

## 2016-06-17 MED ORDER — ATORVASTATIN CALCIUM 40 MG PO TABS: 40.0000 mg | ORAL_TABLET | Freq: Every day | ORAL | 10 refills | Status: DC

## 2016-06-17 NOTE — Patient Instructions (Addendum)
  Your physician wants you to follow-up in: 1 year or sooner if needed. You will receive a reminder letter in the mail two months in advance. If you don't receive a letter, please call our office to schedule the follow-up appointment.  Your physician recommends that you return for lab work in: fasting   If you need a refill on your cardiac medications before your next appointment, please call your pharmacy.

## 2016-06-18 NOTE — Progress Notes (Signed)
Patient ID: Anthony Orr, male   DOB: [birth year], 65 y.o.   MRN: [id number]     HPI: Anthony Orr is a 65 y.o. male presents to the office today for a one-year cardiology evaluation.  In 2002, Mr. Anthony Orr underwent a routine treadmill test and developed a short burst of wide complex tachycardia. Since that time, he has been on low dose beta blocker therapy without recurrence. He has a history of hypertension as well as hyperlipidemia. An echo Doppler study in October 2008 showed normal systolic function but suggested mild mid cavity obliteration with a 10 mm pressure gradient.  Mr. Anthony Orr has remained active.  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 He skis in the winter. He denies any palpitations.  A six-year follow-up echo Doppler study on 03/03/2014 showed mild LVH with normal systolic function with an ejection fraction of 55-60%.  He did not have regional wall motion abnormalities.  There was no mention of any mid cavity obstruction or gradient.  There was suggestion of mild grade 1 diastolic relaxation abnormality.  He had mild left atrial dilatation.  Since I last saw him in November 2016 he has continued to do well.  Last year, he went skiing at least 4 times and did not have any cardiovascular problems at high altitude.  He denies any palpitations.  He had a colonoscopy and endoscopy and was told of not having any polyps but did have some gastric erosions for which she was told to stay off nonsteroidal anti-inflammatory medicine.  He continues to work out at least 2 days per week with a Physiological scientist, place tennis at least one day a week, and also does other activity.  He presents for evaluation  Past Medical History:  Diagnosis Date  . Dyslipidemia   . Hypertension     Past Surgical History:  Procedure Laterality Date  . NM MYOCAR PERF WALL MOTION  [id number]   negative    No Known Allergies  Current Outpatient Prescriptions    Medication Sig Dispense Refill  . amLODipine (NORVASC) 5 MG tablet Take 1 tablet (5 mg total) by mouth daily. 30 tablet 10  . aspirin 81 MG tablet Take 81 mg by mouth daily.    Marland Kitchen atorvastatin (LIPITOR) 40 MG tablet Take 1 tablet (40 mg total) by mouth daily. 30 tablet 10  . metoprolol succinate (TOPROL-XL) 25 MG 24 hr tablet Take 1 tablet (25 mg total) by mouth daily. 30 tablet 11   No current facility-administered medications for this visit.     Social History   Social History  . Marital status: Married    Spouse name: N/A  . Number of children: N/A  . Years of education: N/A   Occupational History  . Not on file.   Social History Main Topics  . Smoking status: Former Smoker    Types: Cigarettes  . Smokeless tobacco: Never Used     Comment: quit in 2007  . Alcohol use 8.4 oz/week    14 Standard drinks or equivalent per week  . Drug use: No  . Sexual activity: Not on file   Other Topics Concern  . Not on file   Social History Narrative  . No narrative on file    Socially he is married and has 4 children and soon-to-be 8 grandchildren. He builds medical buildings. There is no tobacco use. He does drink occasional alcohol.  Family history is notable in that his father is deceased secondary to  cancer.  ROS General: Negative; No fevers, chills, or night sweats;  HEENT: Negative; No changes in vision or hearing, sinus congestion, difficulty swallowing Pulmonary: Negative; No cough, wheezing, shortness of breath, hemoptysis Cardiovascular: Negative; No chest pain, presyncope, syncope, palpitations GI: Negative; No nausea, vomiting, diarrhea, or abdominal pain GU: Negative; No dysuria, hematuria, or difficulty voiding Musculoskeletal: Negative; no myalgias, joint pain, or weakness Hematologic/Oncology: Negative; no easy bruising, bleeding Endocrine: Negative; no heat/cold intolerance; no diabetes Neuro: Negative; no changes in balance, headaches Skin: Negative; No  rashes or skin lesions Psychiatric: Negative; No behavioral problems, depression Sleep: Negative; No snoring, daytime sleepiness, hypersomnolence, bruxism, restless legs, hypnogognic hallucinations, no cataplexy Other comprehensive 14 point system review is negative.   PE BP (!) 147/83   Pulse (!) 57   Ht 5' 8"  (1.727 m)   Wt 197 lb 6.4 oz (89.5 kg)   BMI 30.01 kg/m    Repeat blood pressure by me 132/82.  Wt Readings from Last 3 Encounters:  06/17/16 197 lb 6.4 oz (89.5 kg)  05/17/15 195 lb 3.2 oz (88.5 kg)  05/10/14 195 lb (88.5 kg)   General: Alert, oriented, no distress.  Skin: normal turgor, no rashes HEENT: Normocephalic, atraumatic. Pupils round and reactive; sclera anicteric;no lid lag.  Nose without nasal septal hypertrophy Mouth/Parynx benign; Mallinpatti scale 2 Neck: No JVD, no carotid bruits with normal carotid upstroke Lungs: clear to ausculatation and percussion; no wheezing or rales Chest wall: Nontender to palpation Heart: RRR, s1 s2 normal 1/6 systolic murmur. No diastolic murmur No S3 gallop. No S4 gallop.  No rubs, thrills or heaves Abdomen: soft, nontender; no hepatosplenomehaly, BS+; abdominal aorta nontender and not dilated by palpation. Back: No CVA tenderness Pulses 2+ Extremities: no clubbing cyanosis or edema, Homan's sign negative  Neurologic: grossly nonfocal Psychological: Normal affect and mood  ECG (independently read by me): Sinus bradycardia 57 bpm with first-degree AV block with a PR interval of 218 ms.  No ectopy.  No ST segment changes.  November 2017 ECG (independently read by me): Sinus bradycardia 53 bpm.  First-degree AV block.  No ST segment changes.  QTc interval normal at 382 ms.  October 2015 ECG (independently read by me): Normal sinus rhythm at 62 bpm.  Mild first-degree AV block with a PR interval 21 6/92.  Normal QTc interval at 406 ms.   August 2014ECG: Sinus rhythm at 55 beats per minute. PR interval 202 ms. No significant  ST-T changes.  LABS:     Component Value Date/Time   NA 141 05/31/2015 1059   K 4.4 05/31/2015 1059   CL 104 05/31/2015 1059   CO2 23 05/31/2015 1059   GLUCOSE 81 05/31/2015 1059   BUN 13 05/31/2015 1059   CREATININE 0.93 05/31/2015 1059   CALCIUM 9.1 05/31/2015 1059   Hepatic Function Latest Ref Rng & Units 05/31/2015 05/17/2014  Total Protein 6.1 - 8.1 g/dL 6.5 6.9  Albumin 3.6 - 5.1 g/dL 4.2 4.5  AST 10 - 35 U/L 25 25  ALT 9 - 46 U/L 31 29  Alk Phosphatase 40 - 115 U/L 53 51  Total Bilirubin 0.2 - 1.2 mg/dL 0.7 0.7   CBC Latest Ref Rng & Units 05/31/2015 05/17/2014 10/10/2010  WBC 4.0 - 10.5 K/uL 5.4 6.6 -  Hemoglobin 13.0 - 17.0 g/dL 14.9 15.7 16.3  Hematocrit 39.0 - 52.0 % 43.5 46.7 -  Platelets 150 - 400 K/uL 164 192 -   Lab Results  Component Value Date   MCV 92.4  05/31/2015   MCV 93.8 05/17/2014   Lab Results  Component Value Date   TSH 1.171 05/31/2015  No results found for: HGBA1C  Lipid Panel     Component Value Date/Time   CHOL 148 05/31/2015 1059   TRIG 89 05/31/2015 1059   HDL 55 05/31/2015 1059   CHOLHDL 2.7 05/31/2015 1059   VLDL 18 05/31/2015 1059   LDLCALC 75 05/31/2015 1059    RADIOLOGY: No results found.    ASSESSMENT AND PLAN: Mr. Gruenwald is 65 year old white male who has a history of hypertension which has been controlled with amlodipine 5 mg in addition to Toprol-XL 25 mg.  In 2002, he was found to have a burst of nonsustained ventricular tachycardia on routine treadmill testing.  He has tolerated low-dose beta blocker with any episodes of palpitations or recurrent documented arrhythmia. Prior to initiating statin therapy his total cholesterol was 228 and LDL cholesterol 177.  He has not had recent lab work but his lipid studies. one year ago were excellent.  His last echo Doppler study showed normal systolic function with a suggestion of grade 1 diastolic dysfunction. Marland Kitchen  He is remaining asymptomatic on current therapy.  I requested that he  undergo repeat fasting lab work.  I will contact him regarding the results and adjustments to his medications are necessary.  He again plans to ski at least 5 times in the upcoming winter.  He will continue current therapy as prescribed.  As long as he remains stable I will see him in one year for reevaluation.   Troy Sine, MD, [facility]  06/18/2016 6:47 PM

## 2016-08-18 LAB — CBC
HEMATOCRIT: 44.3 % (ref 38.5–50.0)
HEMOGLOBIN: 14.8 g/dL (ref 13.2–17.1)
MCH: 31.6 pg (ref 27.0–33.0)
MCHC: 33.4 g/dL (ref 32.0–36.0)
MCV: 94.5 fL (ref 80.0–100.0)
MPV: 9.9 fL (ref 7.5–12.5)
Platelets: 195 10*3/uL (ref 140–400)
RBC: 4.69 MIL/uL (ref 4.20–5.80)
RDW: 14.4 % (ref 11.0–15.0)
WBC: 5.5 10*3/uL (ref 3.8–10.8)

## 2016-08-19 LAB — COMPREHENSIVE METABOLIC PANEL
ALBUMIN: 4 g/dL (ref 3.6–5.1)
ALT: 35 U/L (ref 9–46)
AST: 29 U/L (ref 10–35)
Alkaline Phosphatase: 42 U/L (ref 40–115)
BUN: 13 mg/dL (ref 7–25)
CHLORIDE: 104 mmol/L (ref 98–110)
CO2: 30 mmol/L (ref 20–31)
Calcium: 9.1 mg/dL (ref 8.6–10.3)
Creat: 0.92 mg/dL (ref 0.70–1.25)
Glucose, Bld: 108 mg/dL — ABNORMAL HIGH (ref 65–99)
POTASSIUM: 4.2 mmol/L (ref 3.5–5.3)
Sodium: 139 mmol/L (ref 135–146)
Total Bilirubin: 0.7 mg/dL (ref 0.2–1.2)
Total Protein: 6.5 g/dL (ref 6.1–8.1)

## 2016-08-19 LAB — TSH: TSH: 1.95 mIU/L (ref 0.40–4.50)

## 2016-08-19 LAB — LIPID PANEL
CHOL/HDL RATIO: 2.7 ratio (ref <5.0)
Cholesterol: 145 mg/dL (ref <200)
HDL: 53 mg/dL (ref >40)
LDL CALC: 71 mg/dL (ref <100)
TRIGLYCERIDES: 105 mg/dL (ref <150)
VLDL: 21 mg/dL (ref <30)

## 2016-08-21 ENCOUNTER — Encounter: Payer: Self-pay | Admitting: *Deleted

## 2017-07-09 ENCOUNTER — Encounter: Payer: Self-pay | Admitting: *Deleted

## 2017-07-09 ENCOUNTER — Other Ambulatory Visit: Payer: Self-pay | Admitting: Cardiovascular Disease

## 2017-07-28 ENCOUNTER — Ambulatory Visit: Payer: 59 | Admitting: Cardiovascular Disease

## 2017-07-28 DIAGNOSIS — R339 Retention of urine, unspecified: Secondary | ICD-10-CM

## 2017-07-28 DIAGNOSIS — E785 Hyperlipidemia, unspecified: Secondary | ICD-10-CM

## 2017-07-28 DIAGNOSIS — I10 Essential (primary) hypertension: Secondary | ICD-10-CM

## 2017-07-28 NOTE — Patient Instructions (Signed)
Medication Instructions:  Your physician recommends that you continue on your current medications as directed. Please refer to the Current Medication list given to you today.   Labwork: Your physician recommends that you return for lab work in: FASTING   Testing/Procedures: none  Follow-Up: Your physician wants you to follow-up in: 12 months with Dr. Tresa EndoKelly. You will receive a reminder letter in the mail two months in advance. If you don't receive a letter, please call our office to schedule the follow-up appointment.   Any Other Special Instructions Will Be Listed Below (If Applicable).     If you need a refill on your cardiac medications before your next appointment, please call your pharmacy.

## 2017-07-28 NOTE — Progress Notes (Signed)
Patient ID: Anthony Orr, male   DOB: Nov 26, 1950, 67 y.o.   MRN: [id number]     HPI: Anthony Orr is a 67 y.o. male presents to the office today for a 13 month follow-up cardiology evaluation.  In 2002, Anthony Orr underwent a routine treadmill test and developed a short burst of wide complex tachycardia. Since that time, he has been on low dose beta blocker therapy without recurrence. He has a history of hypertension as well as hyperlipidemia. An echo Doppler study in October 2008 showed normal systolic function but suggested mild mid cavity obliteration with a 10 mm pressure gradient.  Anthony Orr has remained active.  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 He skis in the winter. He denies any palpitations.  A six-year follow-up echo Doppler study on 03/03/2014 showed mild LVH with normal systolic function with an ejection fraction of 55-60%.  He did not have regional wall motion abnormalities.  There was no mention of any mid cavity obstruction or gradient.  There was suggestion of mild grade 1 diastolic relaxation abnormality.  He had mild left atrial dilatation.  2016.  He had undergone colonoscopy was not found to have polyps.  In size compared did reveal some gastric erosions and he has been off nonsteroidal anti-inflammatory medications.  When I last saw him in December 2017 he was remaining stable.  He did not have any chest pain.  He underwent subsequent laboratory in February 2018 which showed a total cholesterol 145, triglycerides 105, HDL 53, and LDL cholesterol of 71.  He has continued to take atorvastatin 40 mg.  He also has been on amlodipine 5 mg and Toprol-XL 25 mg daily for hypertension.  He's not had any palpitations or arrhythmias.  He continues to be active.  Over the past several years.  He has skied at least 4 times per season.  He plans to ski again in the upcoming months.  He continues to play tennis at least one day per week.  He  works out with a Physiological scientist several days per week.  He presents for evaluation.  Past Medical History:  Diagnosis Date  . Dyslipidemia   . Hypertension     Past Surgical History:  Procedure Laterality Date  . NM MYOCAR PERF WALL MOTION  [id number]   negative    No Known Allergies  Current Outpatient Medications  Medication Sig Dispense Refill  . amLODipine (NORVASC) 5 MG tablet TAKE 1 TABLET BY MOUTH ONCE DAILY 30 tablet 0  . atorvastatin (LIPITOR) 40 MG tablet TAKE 1 TABLET BY MOUTH ONCE DAILY 30 tablet 0  . metoprolol succinate (TOPROL-XL) 25 MG 24 hr tablet TAKE 1 TABLET BY MOUTH ONCE DAILY 30 tablet 0   No current facility-administered medications for this visit.     Social History   Socioeconomic History  . Marital status: Married    Spouse name: Not on file  . Number of children: Not on file  . Years of education: Not on file  . Highest education level: Not on file  Social Needs  . Financial resource strain: Not on file  . Food insecurity - worry: Not on file  . Food insecurity - inability: Not on file  . Transportation needs - medical: Not on file  . Transportation needs - non-medical: Not on file  Occupational History  . Not on file  Tobacco Use  . Smoking status: Former Smoker    Types: Cigarettes  . Smokeless tobacco: Never  Used  . Tobacco comment: quit in 2007  Substance and Sexual Activity  . Alcohol use: Yes    Alcohol/week: 8.4 oz    Types: 14 Standard drinks or equivalent per week  . Drug use: No  . Sexual activity: Not on file  Other Topics Concern  . Not on file  Social History Narrative  . Not on file    Socially he is married and has 4 children and now has 9 grandchildren. He builds medical buildings. There is no tobacco use. He does drink occasional alcohol.  Family history is notable in that his father is deceased secondary to cancer.  ROS General: Negative; No fevers, chills, or night sweats;  HEENT: Negative; No changes in  vision or hearing, sinus congestion, difficulty swallowing Pulmonary: Negative; No cough, wheezing, shortness of breath, hemoptysis Cardiovascular: Negative; No chest pain, presyncope, syncope, palpitations GI: Negative; No nausea, vomiting, diarrhea, or abdominal pain GU: At times he may not complete his urination and urinates frequently Musculoskeletal: Negative; no myalgias, joint pain, or weakness Hematologic/Oncology: Negative; no easy bruising, bleeding Endocrine: Negative; no heat/cold intolerance; no diabetes Neuro: Negative; no changes in balance, headaches Skin: Negative; No rashes or skin lesions Psychiatric: Negative; No behavioral problems, depression Sleep: Negative; No snoring, daytime sleepiness, hypersomnolence, bruxism, restless legs, hypnogognic hallucinations, no cataplexy Other comprehensive 14 point system review is negative.   PE BP (!) 151/89   Pulse (!) 52   Ht _0  (1.727 m)   Wt 196 lb (88.9 kg)   BMI 29.80 kg/m    Repeat blood pressure by me was 132/82  Wt Readings from Last 3 Encounters:  07/28/17 196 lb (88.9 kg)  06/17/16 197 lb 6.4 oz (89.5 kg)  05/17/15 195 lb 3.2 oz (88.5 kg)   General: Alert, oriented, no distress.  Skin: normal turgor, no rashes, warm and dry HEENT: Normocephalic, atraumatic. Pupils equal round and reactive to light; sclera anicteric; extraocular muscles intact;  Nose without nasal septal hypertrophy Mouth/Parynx benign; Mallinpatti scale 2 Neck: No JVD, no carotid bruits; normal carotid upstroke Lungs: clear to ausculatation and percussion; no wheezing or rales Chest wall: without tenderness to palpitation Heart: PMI not displaced, RRR, s1 s2 normal, 1/6 systolic murmur, no diastolic murmur, no rubs, gallops, thrills, or heaves Abdomen: soft, nontender; no hepatosplenomehaly, BS+; abdominal aorta nontender and not dilated by palpation. Back: no CVA tenderness Pulses 2+ Musculoskeletal: full range of motion, normal  strength, no joint deformities Extremities: no clubbing cyanosis or edema, Homan's sign negative  Neurologic: grossly nonfocal; Cranial nerves grossly wnl Psychologic: Normal mood and affect   ECG (independently read by me): Sinus bradycardia at 52 bpm.  First degree AV block with a PR interval at 232 ms.  No significant ST-T changes.  No ectopy.  December 2017 ECG (independently read by me): Sinus bradycardia 57 bpm with first-degree AV block with a PR interval of 218 ms.  No ectopy.  No ST segment changes.  November 2017 ECG (independently read by me): Sinus bradycardia 53 bpm.  First-degree AV block.  No ST segment changes.  QTc interval normal at 382 ms.  October 2015 ECG (independently read by me): Normal sinus rhythm at 62 bpm.  Mild first-degree AV block with a PR interval 21 6/92.  Normal QTc interval at 406 ms.   August 2014ECG: Sinus rhythm at 55 beats per minute. PR interval 202 ms. No significant ST-T changes.  LABS:     Component Value Date/Time   NA 139 08/18/2016 0927  K 4.2 08/18/2016 0927   CL 104 08/18/2016 0927   CO2 30 08/18/2016 0927   GLUCOSE 108 (H) 08/18/2016 0927   BUN 13 08/18/2016 0927   CREATININE 0.92 08/18/2016 0927   CALCIUM 9.1 08/18/2016 0927   Hepatic Function Latest Ref Rng & Units 08/18/2016 05/31/2015 05/17/2014  Total Protein 6.1 - 8.1 g/dL 6.5 6.5 6.9  Albumin 3.6 - 5.1 g/dL 4.0 4.2 4.5  AST 10 - 35 U/L _0 ALT 9 - 46 U/L 35 31 29  Alk Phosphatase 40 - 115 U/L 42 53 51  Total Bilirubin 0.2 - 1.2 mg/dL 0.7 0.7 0.7   CBC Latest Ref Rng & Units 08/18/2016 05/31/2015 05/17/2014  WBC 3.8 - 10.8 K/uL 5.5 5.4 6.6  Hemoglobin 13.2 - 17.1 g/dL 14.8 14.9 15.7  Hematocrit 38.5 - 50.0 % 44.3 43.5 46.7  Platelets 140 - 400 K/uL 195 164 192   Lab Results  Component Value Date   MCV 94.5 08/18/2016   MCV 92.4 05/31/2015   MCV 93.8 05/17/2014   Lab Results  Component Value Date   TSH 1.95 08/18/2016  No results found for: HGBA1C  Lipid  Panel     Component Value Date/Time   CHOL 145 08/18/2016 0927   TRIG 105 08/18/2016 0927   HDL 53 08/18/2016 0927   CHOLHDL 2.7 08/18/2016 0927   VLDL 21 08/18/2016 0927   LDLCALC 71 08/18/2016 0927    RADIOLOGY: No results found.  IMPRESSION: No diagnosis found.   ASSESSMENT AND PLAN: Anthony Orr is 67 year old white male who has a history of hypertension which has been controlled with amlodipine 5 mg in addition to Toprol-XL 25 mg.  In 2002, he was found to have a burst of nonsustained ventricular tachycardia on routine treadmill testing.  He has tolerated low-dose beta blocker with any episodes of palpitations or recurrent documented arrhythmia. Prior to initiating statin therapy his total cholesterol was 228 and LDL cholesterol 177.  He has now been on statin therapy for several years and consistently is had well controlled.  Lipid studies with LDL cholesterols in the 70s.  His last lipid study in February 2018 showed a LDL of 71 with total cholesterol 145, triglycerides 105, and HDL 53.   His last echo Doppler study showed normal systolic function with a suggestion of grade 1 diastolic dysfunction.  He is remaining asymptomatic on current therapy.  He plans to see numerous times this winter.  He will return in the fasting state for laboratory.  He also has noticed frequent urination.  There is a family history for prostate CA.  I will also check a PSA with his semen, TSH, CBC, and lipid panel.  I will contact him regarding the results.  I will see him in one year for reevaluation.  Troy Sine, MD, [facility]  07/28/2017 1:37 PM

## 2017-07-30 ENCOUNTER — Encounter: Payer: Self-pay | Admitting: Cardiovascular Disease

## 2017-08-06 LAB — LIPID PANEL
CHOL/HDL RATIO: 2.9 ratio (ref 0.0–5.0)
Cholesterol, Total: 178 mg/dL (ref 100–199)
HDL: 62 mg/dL (ref >39)
LDL Calculated: 97 mg/dL (ref 0–99)
TRIGLYCERIDES: 94 mg/dL (ref 0–149)
VLDL Cholesterol Cal: 19 mg/dL (ref 5–40)

## 2017-08-06 LAB — COMPREHENSIVE METABOLIC PANEL
ALBUMIN: 4.5 g/dL (ref 3.6–4.8)
ALT: 49 IU/L — ABNORMAL HIGH (ref 0–44)
AST: 43 IU/L — ABNORMAL HIGH (ref 0–40)
Albumin/Globulin Ratio: 1.6 (ref 1.2–2.2)
Alkaline Phosphatase: 51 IU/L (ref 39–117)
BILIRUBIN TOTAL: 0.8 mg/dL (ref 0.0–1.2)
BUN / CREAT RATIO: 9 — AB (ref 10–24)
BUN: 10 mg/dL (ref 8–27)
CO2: 25 mmol/L (ref 20–29)
CREATININE: 1.14 mg/dL (ref 0.76–1.27)
Calcium: 10 mg/dL (ref 8.6–10.2)
Chloride: 101 mmol/L (ref 96–106)
GFR calc non Af Amer: 66 mL/min/{1.73_m2} (ref >59)
GFR, EST AFRICAN AMERICAN: 76 mL/min/{1.73_m2} (ref >59)
GLUCOSE: 89 mg/dL (ref 65–99)
Globulin, Total: 2.8 g/dL (ref 1.5–4.5)
Potassium: 5.1 mmol/L (ref 3.5–5.2)
Sodium: 147 mmol/L — ABNORMAL HIGH (ref 134–144)
TOTAL PROTEIN: 7.3 g/dL (ref 6.0–8.5)

## 2017-08-06 LAB — TSH: TSH: 1.69 u[IU]/mL (ref 0.450–4.500)

## 2017-08-06 LAB — CBC WITH DIFFERENTIAL/PLATELET
BASOS ABS: 0 10*3/uL (ref 0.0–0.2)
Basos: 1 %
EOS (ABSOLUTE): 0.3 10*3/uL (ref 0.0–0.4)
Eos: 5 %
HEMOGLOBIN: 15.7 g/dL (ref 13.0–17.7)
Hematocrit: 47.6 % (ref 37.5–51.0)
Immature Grans (Abs): 0 10*3/uL (ref 0.0–0.1)
Immature Granulocytes: 0 %
LYMPHS ABS: 1.6 10*3/uL (ref 0.7–3.1)
Lymphs: 24 %
MCH: 31.2 pg (ref 26.6–33.0)
MCHC: 33 g/dL (ref 31.5–35.7)
MCV: 94 fL (ref 79–97)
Monocytes Absolute: 1.1 10*3/uL — ABNORMAL HIGH (ref 0.1–0.9)
Monocytes: 16 %
NEUTROS ABS: 3.7 10*3/uL (ref 1.4–7.0)
Neutrophils: 54 %
PLATELETS: 169 10*3/uL (ref 150–379)
RBC: 5.04 x10E6/uL (ref 4.14–5.80)
RDW: 14.2 % (ref 12.3–15.4)
WBC: 6.7 10*3/uL (ref 3.4–10.8)

## 2017-08-06 LAB — PSA: Prostate Specific Ag, Serum: 1.2 ng/mL (ref 0.0–4.0)

## 2017-08-13 ENCOUNTER — Encounter: Payer: Self-pay | Admitting: *Deleted

## 2017-08-19 ENCOUNTER — Other Ambulatory Visit: Payer: Self-pay | Admitting: Cardiovascular Disease

## 2017-12-25 ENCOUNTER — Telehealth: Payer: Self-pay

## 2017-12-25 NOTE — Telephone Encounter (Signed)
   [facility] Pre-operative Risk Assessment    Request for surgical clearance:  1. What type of surgery is being performed? Left Knee Scope Medial Menisectomy  2. When is this surgery scheduled? TBD   3. What type of clearance is required (medical clearance vs. Pharmacy clearance to hold med vs. Both)? Medical  4. Are there any medications that need to be held prior to surgery and how long?N?A   5. Practice name and name of physician performing surgery? [facility]- Dr. Fredonia Highland  6. What is your office phone number  [phone] ext [phone]    7.   What is your office fax number [phone] ATT: Kelly  8.   Anesthesia type (None, local, MAC, general) ? Unknown    Anthony Orr 12/25/2017, 11:21 AM  _________________________________________________________________   (provider comments below)

## 2017-12-28 NOTE — Telephone Encounter (Signed)
   Primary Cardiologist: Nicki Guadalajarahomas Kelly, MD  Chart reviewed as part of pre-operative protocol coverage. Given past medical history and time since last visit, based on ACC/AHA guidelines, Ike BeneJames F Mostafa would be at acceptable risk for the planned procedure without further cardiovascular testing. Pt is low risk for low risk procedure.  No known CAD.    I will route this recommendation to the requesting party via Epic fax function and remove from pre-op pool.  Please call with questions.  Nada BoozerLaura Ingold, NP 12/28/2017, 3:06 PM

## 2018-08-31 ENCOUNTER — Other Ambulatory Visit: Payer: Self-pay | Admitting: Cardiovascular Disease

## 2018-09-07 ENCOUNTER — Ambulatory Visit: Payer: 59 | Admitting: Cardiovascular Disease

## 2018-11-12 ENCOUNTER — Telehealth: Payer: Self-pay | Admitting: Cardiovascular Disease

## 2018-11-12 NOTE — Telephone Encounter (Signed)
LMTCB to change visit to video or telephone. 

## 2018-11-15 ENCOUNTER — Telehealth (INDEPENDENT_AMBULATORY_CARE_PROVIDER_SITE_OTHER): Payer: 59 | Admitting: Cardiovascular Disease

## 2018-11-15 VITALS — Ht 68.0 in | Wt 190.0 lb

## 2018-11-15 DIAGNOSIS — I519 Heart disease, unspecified: Secondary | ICD-10-CM | POA: Diagnosis not present

## 2018-11-15 DIAGNOSIS — I5189 Other ill-defined heart diseases: Secondary | ICD-10-CM

## 2018-11-15 DIAGNOSIS — E7800 Pure hypercholesterolemia, unspecified: Secondary | ICD-10-CM | POA: Diagnosis not present

## 2018-11-15 DIAGNOSIS — I10 Essential (primary) hypertension: Secondary | ICD-10-CM | POA: Diagnosis not present

## 2018-11-15 NOTE — Progress Notes (Signed)
Virtual Visit via Telephone Note   This visit type was conducted due to national recommendations for restrictions regarding the COVID-19 Pandemic (e.g. social distancing) in an effort to limit this patient's exposure and mitigate transmission in our community.  Due to his co-morbid illnesses, this patient is at least at moderate risk for complications without adequate follow up.  This format is felt to be most appropriate for this patient at this time.  The patient did not have access to video technology/had technical difficulties with video requiring transitioning to audio format only (telephone).  All issues noted in this document were discussed and addressed.  No physical exam could be performed with this format.  Please refer to the patient's chart for his  consent to telehealth for [facility].   Date:  11/15/2018   ID:  Anthony BeneJames F Orman, DOB [birth year], MRN [id number]  Patient Location: Home Provider Location: Home  PCP:  Patient, No Pcp Per  Cardiologist:  Nicki Guadalajarahomas Eldean Nanna, MD  Electrophysiologist:  None   Evaluation Performed:  Follow-Up Visit  Chief Complaint:  15 month F/U ov  History of Present Illness:    Anthony Orr is a 68 y.o. male who underwent a routine treadmill test and developed a short burst of wide complex tachycardia. Since that time, he has been on low dose beta blocker therapy without recurrence. He has a history of hypertension as well as hyperlipidemia. An echo Doppler study in October 2008 showed normal systolic function but suggested mild mid cavity obliteration with a 10 mm pressure gradient.  Mr. Gaynell FaceMarshall has remained active.  He is in the Chiropractorcommercial construction business. He exercises fairly regularly. He skis in the winter. He denies any palpitations.  A six-year follow-up echo Doppler study on 03/03/2014 showed mild LVH with normal systolic function with an ejection fraction of 55-60%.  He did not have regional wall motion abnormalities.  There was no  mention of any mid cavity obstruction or gradient.  There was suggestion of mild grade 1 diastolic relaxation abnormality.  He had mild left atrial dilatation.  2016.  He had undergone colonoscopy was not found to have polyps.  In size compared did reveal some gastric erosions and he has been off nonsteroidal anti-inflammatory medications.  When I  saw him in December 2017 he was remaining stable.  He did not have any chest pain.  He underwent subsequent laboratory in February 2018 which showed a total cholesterol 145, triglycerides 105, HDL 53, and LDL cholesterol of 71.    I last saw him in January 2019.  At that time he continued to be on atorvastatin 40 mg for hyperlipidemia and was on on amlodipine 5 mg and Toprol-XL 25 mg daily for hypertension.  He did not had any palpitations or arrhythmias.  He continued to be active.    He typically would stay at least 4 times per year, worked with a Systems analystpersonal trainer several days per week and play tennis at least weekly.  Subsequent blood work on August 05, 2017 showed total cholesterol 178, triglycerides 94, HDL 62, and his LDL had risen slightly from 71 up to 97.  Laboratory was normal except he did have very minimal elevation of ALT and AST at 43 and 49, respectively.  Over the past year, he has remained stable.  He underwent meniscus surgery on his left knee in July which hampered his physical activity for several once.  However intact and his.  He is back riding bike.  He skied 1  time this year.  To me he applied for additional life insurance at the end of the year with 2 different companies.  He had complete of laboratory drawn at that time.  He has the results of these laboratories on his office at work he currently is at [employer] and will send me the results of his laboratory when he returns home in several weeks.  He denies chest pain PND orthopnea.  He denies palpitations.  He feels well.  He has no complaints.   The patient does not have  symptoms concerning for COVID-19 infection (fever, chills, cough, or new shortness of breath).    Past Medical History:  Diagnosis Date  . Dyslipidemia   . Hypertension    Past Surgical History:  Procedure Laterality Date  . NM MYOCAR PERF WALL MOTION  [id number]   negative     Current Meds  Medication Sig  . amLODipine (NORVASC) 5 MG tablet TAKE 1 TABLET BY MOUTH EVERY DAY  . atorvastatin (LIPITOR) 40 MG tablet TAKE 1 TABLET BY MOUTH EVERY DAY  . metoprolol succinate (TOPROL-XL) 25 MG 24 hr tablet TAKE 1 TABLET BY MOUTH EVERY DAY     Allergies:   Patient has no known allergies.   Social History   Tobacco Use  . Smoking status: Former Smoker    Types: Cigarettes  . Smokeless tobacco: Never Used  . Tobacco comment: quit in 2007  Substance Use Topics  . Alcohol use: Yes    Alcohol/week: 14.0 standard drinks    Types: 14 Standard drinks or equivalent per week  . Drug use: No    Socially he is married and has 4 children and now has 11 grandchildren. He builds medical buildings. There is no tobacco use. He does drink occasional alcohol.  Family Hx: The patient's family history includes Cancer in his father.  ROS:   Please see the history of present illness.    No change in vision or hearing. No chest pain or exertional dyspnea No palpitations No abdominal pain Recent right meniscus surgery, July 2019 Mild arthritis. No myalgias No neurologic symptoms No diabetes or thyroid issues Sleeping well   All other systems reviewed and are negative.   Prior CV studies:   The following studies were reviewed today:  I reviewed his most recent laboratory from last year and will be reviewing the laboratory that he sends to [place]  Labs/Other Tests and Data Reviewed:    EKG:  An ECG dated 07/28/2017 was personally reviewed today and demonstrated:  Sinus bradycardia at 52 bpm.  First degree AV block with a PR interval at 232 ms.  No significant ST-T changes.  No ectopy.  Recent  Labs: No results found for requested labs within last 8760 hours.   Recent Lipid Panel Lab Results  Component Value Date/Time   CHOL 178 08/05/2017 12:26 PM   TRIG 94 08/05/2017 12:26 PM   HDL 62 08/05/2017 12:26 PM   CHOLHDL 2.9 08/05/2017 12:26 PM   CHOLHDL 2.7 08/18/2016 09:27 AM   LDLCALC 97 08/05/2017 12:26 PM    Wt Readings from Last 3 Encounters:  11/15/18 190 lb (86.2 kg)  07/28/17 196 lb (88.9 kg)  06/17/16 197 lb 6.4 oz (89.5 kg)     Objective:    Vital Signs:  Ht 5\' 8"  (1.727 m)   Wt 190 lb (86.2 kg)   BMI 28.89 kg/m    He states his blood pressure typically runs around 130/86; pulse typically is in the  50- 60s.  He is unaware of any palpitations He denies any change in physical appearance Breathing is normal and unlabored There is no wheezing He did not discomfort to palpation of his chest or abdomen He denies any myalgias or arthralgias He denies any edema There are no neurologic symptoms He has normal affect mood and cognition  ASSESSMENT & PLAN:    1. Essential hypertension: His blood pressure continues to be fairly well controlled on his regimen consisting of amlodipine 5 mg in addition to Toprol XL 25 mg.  He is unaware of any palpitations.  Remotely, on a routine treadmill test he had a short burst of wide-complex tachycardia which was self-limited and has been on beta-blocker ever since without awareness of arrhythmia. 2. Hyperlipidemia: He is tolerating atorvastatin 40 mg.  Laboratory in January 2019 showed slight crease of LDL from 71-97.  I will obtain the results of recent laboratory of his insurance physicals.  Adjustment of therapy will be made if necessary. 3. Grade 1 diastolic dysfunction: Noted on echo Doppler study.  He is asymptomatic.  There is no exertional dyspnea.  Probably secondary to his mild hypertension with mild LVH. 4. Minimal transaminase elevation: I will await follow-up analysis for work that has been done.  If LFTs have not been  done these will be obtained upon his return from the beach.  COVID-19 Education: The signs and symptoms of COVID-19 were discussed with the patient and how to seek care for testing (follow up with PCP or arrange E-visit).  The importance of social distancing was discussed today.  Time:   Today, I have spent 20 minutes with the patient with telehealth technology discussing the above problems.     Medication Adjustments/Labs and Tests Ordered: Current medicines are reviewed at length with the patient today.  Concerns regarding medicines are outlined above.   Tests Ordered: No orders of the defined types were placed in this encounter.   Medication Changes: No orders of the defined types were placed in this encounter.   Disposition:  Follow up I will contact him regarding his laboratory if additional blood work is necessary; otherwise, I will see him in 1 year  Signed, Nicki Guadalajara, MD  11/15/2018 4:24 PM    [facility]

## 2018-11-15 NOTE — Patient Instructions (Signed)
Medication Instructions:  The current medical regimen is effective;  continue present plan and medications.  If you need a refill on your cardiac medications before your next appointment, please call your pharmacy.   Follow-Up: At [facility], you and your health needs are our priority.  As part of our continuing mission to provide you with exceptional heart care, we have created designated Provider Care Teams.  These Care Teams include your primary Cardiologist (physician) and Advanced Practice Providers (APPs -  Physician Assistants and Nurse Practitioners) who all work together to provide you with the care you need, when you need it. You will need a follow up appointment in 12 months.  Please call our office 2 months in advance to schedule this appointment.  You may see Nicki Guadalajara, MD or one of the following Advanced Practice Providers on your designated Care Team: [place], [place] . Micah Flesher, PA-C  Any Other Special Instructions Will Be Listed Below (If Applicable). Send blood work.

## 2018-12-30 ENCOUNTER — Other Ambulatory Visit: Payer: Self-pay

## 2018-12-30 MED ORDER — AMLODIPINE BESYLATE 5 MG PO TABS: 5.0000 mg | ORAL_TABLET | Freq: Every day | ORAL | 3 refills | Status: DC

## 2018-12-30 MED ORDER — ATORVASTATIN CALCIUM 40 MG PO TABS: 40.0000 mg | ORAL_TABLET | Freq: Every day | ORAL | 3 refills | Status: DC

## 2018-12-30 MED ORDER — METOPROLOL SUCCINATE ER 25 MG PO TB24: 25.0000 mg | ORAL_TABLET | Freq: Every day | ORAL | 3 refills | Status: DC

## 2019-07-01 ENCOUNTER — Ambulatory Visit: Payer: 59 | Attending: Internal Medicine

## 2019-07-01 DIAGNOSIS — Z20822 Contact with and (suspected) exposure to covid-19: Secondary | ICD-10-CM

## 2019-07-03 LAB — NOVEL CORONAVIRUS, NAA: SARS-CoV-2, NAA: NOT DETECTED

## 2019-08-11 ENCOUNTER — Ambulatory Visit: Payer: 59

## 2019-08-20 ENCOUNTER — Ambulatory Visit: Payer: 59 | Attending: Internal Medicine

## 2019-08-20 DIAGNOSIS — Z23 Encounter for immunization: Secondary | ICD-10-CM | POA: Insufficient documentation

## 2019-08-20 NOTE — Progress Notes (Signed)
   Covid-19 Vaccination Clinic  Name:  Anthony Orr    MRN: [id number] DOB: [birth year]  08/20/2019  Anthony Orr was observed post Covid-19 immunization for 15 minutes without incidence. He was provided with Vaccine Information Sheet and instruction to access the V-Safe system.   Anthony Orr was instructed to call 911 with any severe reactions post vaccine: Marland Kitchen Difficulty breathing  . Swelling of your face and throat  . A fast heartbeat  . A bad rash all over your body  . Dizziness and weakness    Immunizations Administered    Name Date Dose VIS Date Route   Pfizer COVID-19 Vaccine 08/20/2019  8:42 AM 0.3 mL 06/24/2019 Intramuscular   Manufacturer: [employer], [employer]   Lot: [id number]   NDC: [address]-9

## 2019-08-22 ENCOUNTER — Ambulatory Visit: Payer: 59

## 2019-09-12 ENCOUNTER — Ambulatory Visit: Payer: 59 | Attending: Internal Medicine

## 2019-09-12 DIAGNOSIS — Z23 Encounter for immunization: Secondary | ICD-10-CM | POA: Insufficient documentation

## 2019-09-12 NOTE — Progress Notes (Signed)
   Covid-19 Vaccination Clinic  Name:  Anthony Orr    MRN: [id number] DOB: 09-18-1950  09/12/2019  Mr. Anthony Orr was observed post Covid-19 immunization for 15 minutes without incidence. He was provided with Vaccine Information Sheet and instruction to access the V-Safe system.   Mr. Anthony Orr was instructed to call 911 with any severe reactions post vaccine: Marland Kitchen Difficulty breathing  . Swelling of your face and throat  . A fast heartbeat  . A bad rash all over your body  . Dizziness and weakness    Immunizations Administered    Name Date Dose VIS Date Route   Pfizer COVID-19 Vaccine 09/12/2019  3:05 PM 0.3 mL 06/24/2019 Intramuscular   Manufacturer: [employer], [employer]   Lot: [id number]   NDC: [address]-4

## 2020-01-02 ENCOUNTER — Other Ambulatory Visit: Payer: Self-pay

## 2020-01-02 ENCOUNTER — Encounter: Payer: Self-pay | Admitting: Cardiovascular Disease

## 2020-01-02 ENCOUNTER — Ambulatory Visit: Payer: 59 | Admitting: Cardiovascular Disease

## 2020-01-02 VITALS — BP 140/78 | HR 60 | Ht 68.0 in | Wt 196.4 lb

## 2020-01-02 DIAGNOSIS — E7800 Pure hypercholesterolemia, unspecified: Secondary | ICD-10-CM | POA: Diagnosis not present

## 2020-01-02 DIAGNOSIS — I10 Essential (primary) hypertension: Secondary | ICD-10-CM

## 2020-01-02 DIAGNOSIS — I519 Heart disease, unspecified: Secondary | ICD-10-CM | POA: Diagnosis not present

## 2020-01-02 DIAGNOSIS — Z9889 Other specified postprocedural states: Secondary | ICD-10-CM

## 2020-01-02 DIAGNOSIS — I5189 Other ill-defined heart diseases: Secondary | ICD-10-CM

## 2020-01-02 DIAGNOSIS — Z85820 Personal history of malignant melanoma of skin: Secondary | ICD-10-CM

## 2020-01-02 DIAGNOSIS — I440 Atrioventricular block, first degree: Secondary | ICD-10-CM

## 2020-01-02 MED ORDER — AMLODIPINE BESYLATE 5 MG PO TABS: 7.5000 mg | ORAL_TABLET | Freq: Every day | ORAL | 3 refills | Status: DC

## 2020-01-02 NOTE — Patient Instructions (Signed)
Medication Instructions:  INCREASE AMLODIPINE TO 7.5MG  DAILY (1.5 TABS)  *If you need a refill on your cardiac medications before your next appointment, please call your pharmacy*   Lab Work: FASTING LABS: CMET, CBC, TSH, LIPID  If you have labs (blood work) drawn today and your tests are completely normal, you will receive your results only by: Marland Kitchen MyChart Message (if you have MyChart) OR . A paper copy in the mail If you have any lab test that is abnormal or we need to change your treatment, we will call you to review the results.   Follow-Up: At [facility], you and your health needs are our priority.  As part of our continuing mission to provide you with exceptional heart care, we have created designated Provider Care Teams.  These Care Teams include your primary Cardiologist (physician) and Advanced Practice Providers (APPs -  Physician Assistants and Nurse Practitioners) who all work together to provide you with the care you need, when you need it.  We recommend signing up for the patient portal called "MyChart".  Sign up information is provided on this After Visit Summary.  MyChart is used to connect with patients for Virtual Visits (Telemedicine).  Patients are able to view lab/test results, encounter notes, upcoming appointments, etc.  Non-urgent messages can be sent to your provider as well.   To learn more about what you can do with MyChart, go to [email or web address].    Your next appointment:   6 month(s)  The format for your next appointment:   In Person  Provider:   Nicki Guadalajara, MD   Other Instructions MONITOR YOUR BP PERIODICALLY

## 2020-01-02 NOTE — Progress Notes (Signed)
Patient ID: Anthony Orr, male   DOB: [birth year], 69 y.o.   MRN: [id number]     HPI: Anthony Orr is a 69 y.o. male presents to the office today for a 13 month follow-up cardiology evaluation.  In 2002, Mr. Mccorkle underwent a routine treadmill test and developed a short burst of wide complex tachycardia. Since that time, he has been on low dose beta blocker therapy without recurrence. He has a history of hypertension as well as hyperlipidemia. An echo Doppler study in October 2008 showed normal systolic function but suggested mild mid cavity obliteration with a 10 mm pressure gradient.  Mr. Hietala has remained active.  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 He skis in the winter. He denies any palpitations.  A six-year follow-up echo Doppler study on 03/03/2014 showed mild LVH with normal systolic function with an ejection fraction of 55-60%.  He did not have regional wall motion abnormalities.  There was no mention of any mid cavity obstruction or gradient.  There was suggestion of mild grade 1 diastolic relaxation abnormality.  He had mild left atrial dilatation.  In 2016 he had undergone colonoscopy was not found to have polyps but had some gastric erosions and he has been off nonsteroidal anti-inflammatory medications.  When I saw him in December 2017 he was remaining stable.  He did not have any chest pain.  He underwent subsequent laboratory in February 2018 which showed a total cholesterol 145, triglycerides 105, HDL 53, and LDL cholesterol of 71.  He has continued to take atorvastatin 40 mg.  He also has been on amlodipine 5 mg and Toprol-XL 25 mg daily for hypertension.  He's not had any palpitations or arrhythmias.  He continues to be active.  Over the past several years.  He has skied at least 4 times per season.  He plans to ski again in the upcoming months.  He continues to play tennis at least one day per week.  He works out with a Teacher, adult education several days per week.    He was last evaluated by me on Nov 15, 2018 in a telemedicine visit.  At that time he stated that he had remained stable over the prior year. He underwent meniscus surgery on his left knee in July 2019  which hampered his physical activity for several once.   He is back riding bike.  He skied 1 time this year.  He applied for additional life insurance at the end of the year with 2 different companies.  He had complete of laboratory drawn at that time.  He has the results of these laboratories on his office at work he currently is at [employer] and will send me the results of his laboratory when he returns home in several weeks.  He denies chest pain PND orthopnea.  He denies palpitations.    Since his last evaluation, Clair Gulling has continued to do well from a cardiovascular standpoint.  He bikes regularly.  He does play tennis.  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  Over the past year he has continued to be on amlodipine 5 mg and metoprolol succinate 25 mg daily.  He states his blood pressure typically runs around 536 systolically.  He denies palpitations.  He denies presyncope or syncope.  He has not had recent laboratory.  Past Medical History:  Diagnosis Date  .  Dyslipidemia   . Hypertension     Past Surgical History:  Procedure Laterality Date  . NM MYOCAR PERF WALL MOTION  [id number]   negative    No Known Allergies  Current Outpatient Medications  Medication Sig Dispense Refill  . amLODipine (NORVASC) 5 MG tablet Take 1.5 tablets (7.5 mg total) by mouth daily. 135 tablet 3  . atorvastatin (LIPITOR) 40 MG tablet Take 1 tablet (40 mg total) by mouth daily. 90 tablet 3  . metoprolol succinate (TOPROL-XL) 25 MG 24 hr tablet Take 1 tablet (25 mg total) by mouth daily. 90 tablet 3   No current facility-administered medications for this visit.    Social History    Socioeconomic History  . Marital status: Married    Spouse name: Not on file  . Number of children: Not on file  . Years of education: Not on file  . Highest education level: Not on file  Occupational History  . Not on file  Tobacco Use  . Smoking status: Former Smoker    Types: Cigarettes  . Smokeless tobacco: Never Used  . Tobacco comment: quit in 2007  Vaping Use  . Vaping Use: Never used  Substance and Sexual Activity  . Alcohol use: Yes    Alcohol/week: 14.0 standard drinks    Types: 14 Standard drinks or equivalent per week  . Drug use: No  . Sexual activity: Not on file  Other Topics Concern  . Not on file  Social History Narrative  . Not on file   Social Determinants of Health   Financial Resource Strain:   . Difficulty of Paying Living Expenses:   Food Insecurity:   . Worried About Charity fundraiser in the Last Year:   . Arboriculturist in the Last Year:   Transportation Needs:   . Film/video editor (Medical):   Marland Kitchen Lack of Transportation (Non-Medical):   Physical Activity:   . Days of Exercise per Week:   . Minutes of Exercise per Session:   Stress:   . Feeling of Stress :   Social Connections:   . Frequency of Communication with Friends and Family:   . Frequency of Social Gatherings with Friends and Family:   . Attends Religious Services:   . Active Member of Clubs or Organizations:   . Attends Archivist Meetings:   Marland Kitchen Marital Status:   Intimate Partner Violence:   . Fear of Current or Ex-Partner:   . Emotionally Abused:   Marland Kitchen Physically Abused:   . Sexually Abused:     Socially he is married and has 4 children and now has 9 grandchildren. He builds medical buildings. There is no tobacco use. He does drink occasional alcohol.  Family history is notable in that his father is deceased secondary to cancer.  ROS General: Negative; No fevers, chills, or night sweats;  HEENT: Negative; No changes in vision or hearing, sinus  congestion, difficulty swallowing Pulmonary: Negative; No cough, wheezing, shortness of breath, hemoptysis Cardiovascular: Negative; No chest pain, presyncope, syncope, palpitations GI: Negative; No nausea, vomiting, diarrhea, or abdominal pain GU: At times he may not complete his urination and urinates frequently Musculoskeletal: Negative; no myalgias, joint pain, or weakness Hematologic/Oncology: Negative; no easy bruising, bleeding Endocrine: Negative; no heat/cold intolerance; no diabetes Neuro: Negative; no changes in balance, headaches Skin: Surgical excision of a melanoma near his left nipple, status post resection Psychiatric: Negative; No behavioral problems, depression Sleep: Negative; No snoring, daytime sleepiness, hypersomnolence, bruxism, restless  legs, hypnogognic hallucinations, no cataplexy Other comprehensive 14 point system review is negative.   PE BP 140/78   Pulse 60   Ht 5' 8"  (1.727 m)   Wt 196 lb 6.4 oz (89.1 kg)   BMI 29.86 kg/m    Repeat blood pressure by me 148/78 and on repeat 150/78.  Wt Readings from Last 3 Encounters:  01/02/20 196 lb 6.4 oz (89.1 kg)  11/15/18 190 lb (86.2 kg)  07/28/17 196 lb (88.9 kg)   General: Alert, oriented, no distress.  Skin: normal turgor, no rashes, warm and dry HEENT: Normocephalic, atraumatic. Pupils equal round and reactive to light; sclera anicteric; extraocular muscles intact; Nose without nasal septal hypertrophy Mouth/Parynx benign; Mallinpatti scale 3 Neck: No JVD, no carotid bruits; normal carotid upstroke Lungs: clear to ausculatation and percussion; no wheezing or rales Chest wall: without tenderness to palpitation; status post excision of left nipple due to melanoma in the region Heart: PMI not displaced, RRR, s1 s2 normal, 1/6 systolic murmur, no diastolic murmur, no rubs, gallops, thrills, or heaves Abdomen: soft, nontender; no hepatosplenomehaly, BS+; abdominal aorta nontender and not dilated by  palpation. Back: no CVA tenderness Pulses 2+ Musculoskeletal: full range of motion, normal strength, no joint deformities Extremities: no clubbing cyanosis or edema, Homan's sign negative  Neurologic: grossly nonfocal; Cranial nerves grossly wnl Psychologic: Normal mood and affect  ECG (independently read by me): Normal sinus rhythm at 60 bpm, first-degree AV block with a PR interval of 224 ms.  No ectopy.  Normal QTc interval at 384 ms.  January 2019 ECG (independently read by me): Sinus bradycardia at 52 bpm.  First degree AV block with a PR interval at 232 ms.  No significant ST-T changes.  No ectopy.  December 2017 ECG (independently read by me): Sinus bradycardia 57 bpm with first-degree AV block with a PR interval of 218 ms.  No ectopy.  No ST segment changes.  November 2017 ECG (independently read by me): Sinus bradycardia 53 bpm.  First-degree AV block.  No ST segment changes.  QTc interval normal at 382 ms.  October 2015 ECG (independently read by me): Normal sinus rhythm at 62 bpm.  Mild first-degree AV block with a PR interval 21 6/92.  Normal QTc interval at 406 ms.   August 2014ECG: Sinus rhythm at 55 beats per minute. PR interval 202 ms. No significant ST-T changes.  LABS:     Component Value Date/Time   NA 139 01/03/2020 0919   K 4.3 01/03/2020 0919   CL 103 01/03/2020 0919   CO2 23 01/03/2020 0919   GLUCOSE 92 01/03/2020 0919   GLUCOSE 108 (H) 08/18/2016 0927   BUN 12 01/03/2020 0919   CREATININE 0.88 01/03/2020 0919   CREATININE 0.92 08/18/2016 0927   CALCIUM 9.3 01/03/2020 0919   GFRNONAA 86 01/03/2020 0919   GFRAA 100 01/03/2020 0919   Hepatic Function Latest Ref Rng & Units 01/03/2020 08/05/2017 08/18/2016  Total Protein 6.0 - 8.5 g/dL 6.5 7.3 6.5  Albumin 3.7 - 4.7 g/dL 4.3 4.5 4.0  AST 0 - 40 IU/L 30 43(H) 29  ALT 0 - 44 IU/L 33 49(H) 35  Alk Phosphatase 48 - 121 IU/L 61 51 42  Total Bilirubin 0.0 - 1.2 mg/dL 0.3 0.8 0.7   CBC Latest Ref Rng & Units  01/03/2020 08/05/2017 08/18/2016  WBC 3.4 - 10.8 x10E3/uL 6.9 6.7 5.5  Hemoglobin 13.0 - 17.7 g/dL 15.6 15.7 14.8  Hematocrit 37.5 - 51.0 % 45.3 47.6 44.3  Platelets 150 - 450 x10E3/uL 182 169 195   Lab Results  Component Value Date   MCV 94 01/03/2020   MCV 94 08/05/2017   MCV 94.5 08/18/2016   Lab Results  Component Value Date   TSH 1.790 01/03/2020  No results found for: HGBA1C  Lipid Panel     Component Value Date/Time   CHOL 174 01/03/2020 0919   TRIG 91 01/03/2020 0919   HDL 65 01/03/2020 0919   CHOLHDL 2.7 01/03/2020 0919   CHOLHDL 2.7 08/18/2016 0927   VLDL 21 08/18/2016 0927   LDLCALC 92 01/03/2020 0919    RADIOLOGY: No results found.  IMPRESSION:  1. Essential hypertension   2. Pure hypercholesterolemia   3. Grade I diastolic dysfunction   4. History of melanoma excision   5. First degree AV block    ASSESSMENT AND PLAN: Mr. Mcclenahan is 69 year old white male who has a history of hypertension which had been controlled with amlodipine 5 mg in addition to Toprol-XL 25 mg.  In 2002, he was found to have a burst of nonsustained ventricular tachycardia on routine treadmill testing.  He has tolerated low-dose beta blocker with any episodes of palpitations or recurrent documented arrhythmia. Prior to initiating statin therapy his total cholesterol was 228 and LDL cholesterol 177.  He has now been on statin therapy for several years and consistently is had well controlled lipid studies with LDL cholesterols in the 70s.  A lipid study in February 2018 showed a LDL of 71 with total cholesterol 145, triglycerides 105, and HDL 53.   His last echo Doppler study showed normal systolic function with a suggestion of grade 1 diastolic dysfunction.  His blood pressure today is elevated and he states at home typically his blood pressure has been running around 578 systolically.  I discussed with him the new hypertensive guidelines with ideal blood pressure less than 120/80 in stage I  hypertension commencing at 130/80.  I am recommending further titration of amlodipine to 7.5 mg daily.  His resting pulse is 60 and he has first-degree AV block and for this reason will not increase his beta-blocker therapy.  He tolerated melanoma resection without cardiovascular compromise.  He has not had recent laboratory and a complete set of laboratory will be obtained.  Adjustments to his medical therapy will be made if necessary.  He has continued to be on atorvastatin 40 mg daily and is tolerating this well.  As long as he is stable I will see him in 6 months for follow-up evaluation.   Troy Sine, MD, [facility]  01/04/2020 2:03 PM

## 2020-01-03 LAB — COMPREHENSIVE METABOLIC PANEL
ALT: 33 IU/L (ref 0–44)
AST: 30 IU/L (ref 0–40)
Albumin/Globulin Ratio: 2 (ref 1.2–2.2)
Albumin: 4.3 g/dL (ref 3.7–4.7)
Alkaline Phosphatase: 61 IU/L (ref 48–121)
BUN/Creatinine Ratio: 14 (ref 10–24)
BUN: 12 mg/dL (ref 8–27)
Bilirubin Total: 0.3 mg/dL (ref 0.0–1.2)
CO2: 23 mmol/L (ref 20–29)
Calcium: 9.3 mg/dL (ref 8.6–10.2)
Chloride: 103 mmol/L (ref 96–106)
Creatinine, Ser: 0.88 mg/dL (ref 0.76–1.27)
GFR calc Af Amer: 100 mL/min/{1.73_m2} (ref >59)
GFR calc non Af Amer: 86 mL/min/{1.73_m2} (ref >59)
Globulin, Total: 2.2 g/dL (ref 1.5–4.5)
Glucose: 92 mg/dL (ref 65–99)
Potassium: 4.3 mmol/L (ref 3.5–5.2)
Sodium: 139 mmol/L (ref 134–144)
Total Protein: 6.5 g/dL (ref 6.0–8.5)

## 2020-01-03 LAB — CBC
Hematocrit: 45.3 % (ref 37.5–51.0)
Hemoglobin: 15.6 g/dL (ref 13.0–17.7)
MCH: 32.5 pg (ref 26.6–33.0)
MCHC: 34.4 g/dL (ref 31.5–35.7)
MCV: 94 fL (ref 79–97)
Platelets: 182 10*3/uL (ref 150–450)
RBC: 4.8 x10E6/uL (ref 4.14–5.80)
RDW: 12.7 % (ref 11.6–15.4)
WBC: 6.9 10*3/uL (ref 3.4–10.8)

## 2020-01-03 LAB — TSH: TSH: 1.79 u[IU]/mL (ref 0.450–4.500)

## 2020-01-03 LAB — LIPID PANEL
Chol/HDL Ratio: 2.7 ratio (ref 0.0–5.0)
Cholesterol, Total: 174 mg/dL (ref 100–199)
HDL: 65 mg/dL (ref >39)
LDL Chol Calc (NIH): 92 mg/dL (ref 0–99)
Triglycerides: 91 mg/dL (ref 0–149)
VLDL Cholesterol Cal: 17 mg/dL (ref 5–40)

## 2020-01-04 ENCOUNTER — Encounter: Payer: Self-pay | Admitting: Cardiovascular Disease

## 2020-03-20 ENCOUNTER — Other Ambulatory Visit: Payer: Self-pay | Admitting: Cardiovascular Disease

## 2020-04-16 ENCOUNTER — Other Ambulatory Visit: Payer: Self-pay | Admitting: Cardiovascular Disease

## 2021-06-28 ENCOUNTER — Telehealth: Payer: Self-pay | Admitting: Cardiovascular Disease

## 2021-06-28 MED ORDER — AMLODIPINE BESYLATE 5 MG PO TABS
7.5000 mg | ORAL_TABLET | Freq: Every day | ORAL | 3 refills | Status: DC
Start: 2021-06-28 — End: 2021-07-22

## 2021-06-28 NOTE — Telephone Encounter (Signed)
Refill complete 

## 2021-06-28 NOTE — Telephone Encounter (Signed)
°*  STAT* If patient is at the pharmacy, call can be transferred to refill team.   1. Which medications need to be refilled? (please list name of each medication and dose if known) amLODipine (NORVASC) 5 MG tablet (patient take 7.5mg  daily)  2. Which pharmacy/location (including street and city if local pharmacy) is medication to be sent to?[id number] - [place], [place] - [address]  3. Do they need a 30 day or 90 day supply? 90 day  Patient is out of meds.

## 2021-07-16 ENCOUNTER — Other Ambulatory Visit: Payer: Self-pay | Admitting: Cardiovascular Disease

## 2021-07-22 ENCOUNTER — Telehealth: Payer: Self-pay | Admitting: Cardiovascular Disease

## 2021-07-22 MED ORDER — ATORVASTATIN CALCIUM 40 MG PO TABS: ORAL_TABLET | ORAL | 1 refills | Status: DC

## 2021-07-22 MED ORDER — AMLODIPINE BESYLATE 5 MG PO TABS: 7.5000 mg | ORAL_TABLET | Freq: Every day | ORAL | 3 refills | Status: DC

## 2021-07-22 MED ORDER — METOPROLOL SUCCINATE ER 25 MG PO TB24: ORAL_TABLET | ORAL | 1 refills | Status: DC

## 2021-07-22 NOTE — Telephone Encounter (Signed)
°*  STAT* If patient is at the pharmacy, call can be transferred to refill team.   1. Which medications need to be refilled? (please list name of each medication and dose if known)  atorvastatin (LIPITOR) 40 MG tablet metoprolol succinate (TOPROL-XL) 25 MG 24 hr tablet 2. Which pharmacy/location (including street and city if local pharmacy) is medication to be sent to? [id number] - [place],  - [address]  3. Do they need a 30 day or 90 day supply? 90 DAY SUPPLY  PT HAS AN UPCOMING APPT WITH DR Tresa Endo PT IS CURRENTLY OUT OF THESE MEDS

## 2021-11-19 ENCOUNTER — Encounter: Payer: Self-pay | Admitting: Cardiovascular Disease

## 2021-11-19 ENCOUNTER — Ambulatory Visit (INDEPENDENT_AMBULATORY_CARE_PROVIDER_SITE_OTHER): Payer: 59 | Admitting: Cardiovascular Disease

## 2021-11-19 VITALS — BP 100/72 | HR 75 | Ht 68.0 in | Wt 190.2 lb

## 2021-11-19 DIAGNOSIS — I5189 Other ill-defined heart diseases: Secondary | ICD-10-CM | POA: Diagnosis not present

## 2021-11-19 DIAGNOSIS — E7800 Pure hypercholesterolemia, unspecified: Secondary | ICD-10-CM | POA: Diagnosis not present

## 2021-11-19 DIAGNOSIS — I10 Essential (primary) hypertension: Secondary | ICD-10-CM

## 2021-11-19 DIAGNOSIS — Z8719 Personal history of other diseases of the digestive system: Secondary | ICD-10-CM

## 2021-11-19 DIAGNOSIS — Z9889 Other specified postprocedural states: Secondary | ICD-10-CM

## 2021-11-19 DIAGNOSIS — I440 Atrioventricular block, first degree: Secondary | ICD-10-CM | POA: Diagnosis not present

## 2021-11-19 MED ORDER — AMLODIPINE BESYLATE 5 MG PO TABS: 5.0000 mg | ORAL_TABLET | Freq: Every day | ORAL | 3 refills | Status: DC

## 2021-11-19 NOTE — Patient Instructions (Addendum)
Medication Instructions:  ? ?No changes  ? ?*If you need a refill on your cardiac medications before your next appointment, please call your pharmacy* ? ? ?Lab Work: fasting ?CMP ?LIPID ?CBC ?TSH ?PSA ?LPa ? ? ?If you have labs (blood work) drawn today and your tests are completely normal, you will receive your results only by: ?MyChart Message (if you have MyChart) OR ?A paper copy in the mail ?If you have any lab test that is abnormal or we need to change your treatment, we will call you to review the results. ? ? ?Testing/Procedures: ?Not needed ? ? ?Follow-Up: ?At [facility], you and your health needs are our priority.  As part of our continuing mission to provide you with exceptional heart care, we have created designated Provider Care Teams.  These Care Teams include your primary Cardiologist (physician) and Advanced Practice Providers (APPs -  Physician Assistants and Nurse Practitioners) who all work together to provide you with the care you need, when you need it. ? ?  ? ?Your next appointment:   ?   12 month(s) ? ?The format for your next appointment:   ?In Person ? ?Provider:   ?Nicki Guadalajara, MD  ? ? ? ?

## 2021-11-19 NOTE — Progress Notes (Signed)
Patient ID: Anthony Orr, male   DOB: 06/08/1951, 71 y.o.   MRN: [id number] ? ? ? ? ?HPI: Anthony Orr is a 71 y.o. male presents to the office today for a 23 month follow-up cardiology evaluation. ? ?In 2002, Anthony Orr underwent a routine treadmill test and developed a short burst of wide complex tachycardia. Since that time, he has been on low dose beta blocker therapy without recurrence. He has a history of hypertension as well as hyperlipidemia. An echo Doppler study in October 2008 showed normal systolic function but suggested mild mid cavity obliteration with a 10 mm pressure gradient. ? ?Anthony Orr has remained active.  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 He skis in the winter. He denies any palpitations.  A six-year follow-up echo Doppler study on 03/03/2014 showed mild LVH with normal systolic function with an ejection fraction of 55-60%.  He did not have regional wall motion abnormalities.  There was no mention of any mid cavity obstruction or gradient.  There was suggestion of mild grade 1 diastolic relaxation abnormality.  He had mild left atrial dilatation. ? ?In 2016 he had undergone colonoscopy was not found to have polyps but had some gastric erosions and he has been off nonsteroidal anti-inflammatory medications. ? ?When I saw him in December 2017 he was remaining stable.  He did not have any chest pain.  He underwent subsequent laboratory in February 2018 which showed a total cholesterol 145, triglycerides 105, HDL 53, and LDL cholesterol of 71.  He has continued to take atorvastatin 40 mg.  He also has been on amlodipine 5 mg and Toprol-XL 25 mg daily for hypertension.  He's not had any palpitations or arrhythmias.  He continues to be active.  Over the past several years.  He has skied at least 4 times per season.  He plans to ski again in the upcoming months.  He continues to play tennis at least one day per week.  He works out with a Teacher, adult education several days per week.   ? ?He was evaluated by me on Nov 15, 2018 in a telemedicine visit.  At that time he stated that he had remained stable over the prior year. He underwent meniscus surgery on his left knee in July 2019  which hampered his physical activity for several once.   He is back riding bike.  He skied 1 time this year.  He applied for additional life insurance at the end of the year with 2 different companies.  He had complete of laboratory drawn at that time.  He has the results of these laboratories on his office at work he currently is at [employer] and will send me the results of his laboratory when he returns home in several weeks.  He denies chest pain PND orthopnea.  He denies palpitations.   ? ?I last saw him on January 02, 2020.  At that time he continued to do well from a cardiovascular standpoint.  He was remaining active and was biking regularly as well as playing tennis.   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  Over the past year he has continued to be on amlodipine 5 mg and metoprolol succinate 25 mg daily.  He states his blood pressure typically runs around [id number] systolically.  He denied palpitations, presyncope or syncope.  He has not had  recent laboratory.  During that evaluation, with his blood pressure elevation I recommended slight titration of amlodipine to 7.5 mg daily.  Follow-up laboratory was also recommended.  Laboratory January 03, 2020 showed total cholesterol was HDL 65, LDL 92 triglycerides 91. ? ?Since I last saw him, over the past 2 years he has felt well.  He has skied significantly winters.  This past winter he he has skied over 40 times out [place].  He had developed a right inguinal hernia and 5 weeks ago underwent laparoscopic right hernia surgery by Dr. Rosendo Gros.  He has not yet resumed his activity but will be initiating this shortly.  He has not had recent laboratory.  He  denies any chest pain or palpitations.  Evidently he never increased his amlodipine and he continues to be on amlodipine 5 mg, metoprolol succinate 25 mg in addition to atorvastatin 40 mg.  He presents for evaluation. ? ? ?Past Medical History:  ?Diagnosis Date  ? Dyslipidemia   ? Hypertension   ? ? ?Past Surgical History:  ?Procedure Laterality Date  ? NM MYOCAR PERF WALL MOTION  [id number]  ? negative  ? ? ?No Known Allergies ? ?Current Outpatient Medications  ?Medication Sig Dispense Refill  ? atorvastatin (LIPITOR) 20 MG tablet Take by mouth.    ? atorvastatin (LIPITOR) 40 MG tablet TAKE 1 TABLET(40 MG) BY MOUTH DAILY 90 tablet 1  ? doxycycline (VIBRA-TABS) 100 MG tablet Take 100 mg by mouth daily.    ? metoprolol succinate (TOPROL-XL) 25 MG 24 hr tablet TAKE 1 TABLET(25 MG) BY MOUTH DAILY 90 tablet 1  ? amLODipine (NORVASC) 5 MG tablet Take 1 tablet (5 mg total) by mouth daily. 90 tablet 3  ? ?No current facility-administered medications for this visit.  ? ? ?Social History  ? ?Socioeconomic History  ? Marital status: Married  ?  Spouse name: Not on file  ? Number of children: Not on file  ? Years of education: Not on file  ? Highest education level: Not on file  ?Occupational History  ? Not on file  ?Tobacco Use  ? Smoking status: Former  ?  Types: Cigarettes  ? Smokeless tobacco: Never  ? Tobacco comments:  ?  quit in 2007  ?Vaping Use  ? Vaping Use: Never used  ?Substance and Sexual Activity  ? Alcohol use: Yes  ?  Alcohol/week: 14.0 standard drinks  ?  Types: 14 Standard drinks or equivalent per week  ? Drug use: No  ? Sexual activity: Not on file  ?Other Topics Concern  ? Not on file  ?Social History Narrative  ? Not on file  ? ?Social Determinants of Health  ? ?Financial Resource Strain: Not on file  ?Food Insecurity: Not on file  ?Transportation Needs: Not on file  ?Physical Activity: Not on file  ?Stress: Not on file  ?Social Connections: Not on file  ?Intimate Partner Violence: Not on file  ? ? ?Socially  he is married and has 4 children and now has 9 grandchildren. He builds medical buildings. There is no tobacco use. He does drink occasional alcohol. ? ?Family history is notable in that his father is deceased secondary to cancer. ? ?ROS ?General: Negative; No fevers, chills, or night sweats;  ?HEENT: Negative; No changes in vision or hearing, sinus congestion, difficulty swallowing ?Pulmonary: Negative; No cough, wheezing, shortness of breath, hemoptysis ?Cardiovascular: Negative; No chest pain, presyncope, syncope, palpitations ?GI: Negative; No nausea, vomiting, diarrhea, or abdominal pain ?GU: At times he may  not complete his urination and urinates frequently ?Musculoskeletal: Negative; no myalgias, joint pain, or weakness ?Hematologic/Oncology: Negative; no easy bruising, bleeding ?Endocrine: Negative; no heat/cold intolerance; no diabetes ?Neuro: Negative; no changes in balance, headaches ?Skin: Surgical excision of a melanoma near his left nipple, status post resection ?Psychiatric: Negative; No behavioral problems, depression ?Sleep: Negative; No snoring, daytime sleepiness, hypersomnolence, bruxism, restless legs, hypnogognic hallucinations, no cataplexy ?Other comprehensive 14 point system review is negative. ? ? ?PE ?BP 100/72   Pulse 75   Ht 5\' 8"  (1.727 m)   Wt 190 lb 3.2 oz (86.3 kg)   SpO2 93%   BMI 28.92 kg/m?   ? ?Repeat blood pressure by me was 124/78 supine and 130/72 standing. ? ?Wt Readings from Last 3 Encounters:  ?11/19/21 190 lb 3.2 oz (86.3 kg)  ?01/02/20 196 lb 6.4 oz (89.1 kg)  ?11/15/18 190 lb (86.2 kg)  ? ?General: Alert, oriented, no distress.  ?Skin: normal turgor, no rashes, warm and dry ?HEENT: Normocephalic, atraumatic. Pupils equal round and reactive to light; sclera anicteric; extraocular muscles intact; Fundi ** ?Nose without nasal septal hypertrophy ?Mouth/Parynx benign; Mallinpatti scale 3 ?Neck: No JVD, no carotid bruits; normal carotid upstroke ?Lungs: clear to  ausculatation and percussion; no wheezing or rales ?Chest wall: without tenderness to palpitation; status post left nipple excision due to melanoma ?Heart: PMI not displaced, RRR, s1 s2 normal, 1/6 systolic murmur, n

## 2021-11-23 LAB — LIPOPROTEIN A (LPA): Lipoprotein (a): 53.6 nmol/L (ref <75.0)

## 2021-11-23 LAB — PSA: Prostate Specific Ag, Serum: 1.1 ng/mL (ref 0.0–4.0)

## 2021-11-23 LAB — LIPID PANEL
Chol/HDL Ratio: 2 ratio (ref 0.0–5.0)
Cholesterol, Total: 171 mg/dL (ref 100–199)
HDL: 84 mg/dL (ref >39)
LDL Chol Calc (NIH): 75 mg/dL (ref 0–99)
Triglycerides: 62 mg/dL (ref 0–149)
VLDL Cholesterol Cal: 12 mg/dL (ref 5–40)

## 2021-11-23 LAB — COMPREHENSIVE METABOLIC PANEL
ALT: 25 IU/L (ref 0–44)
AST: 26 IU/L (ref 0–40)
Albumin/Globulin Ratio: 2.1 (ref 1.2–2.2)
Albumin: 4.4 g/dL (ref 3.7–4.7)
Alkaline Phosphatase: 60 IU/L (ref 44–121)
BUN/Creatinine Ratio: 13 (ref 10–24)
BUN: 11 mg/dL (ref 8–27)
Bilirubin Total: 0.5 mg/dL (ref 0.0–1.2)
CO2: 24 mmol/L (ref 20–29)
Calcium: 9.3 mg/dL (ref 8.6–10.2)
Chloride: 104 mmol/L (ref 96–106)
Creatinine, Ser: 0.87 mg/dL (ref 0.76–1.27)
Globulin, Total: 2.1 g/dL (ref 1.5–4.5)
Glucose: 98 mg/dL (ref 70–99)
Potassium: 4 mmol/L (ref 3.5–5.2)
Sodium: 143 mmol/L (ref 134–144)
Total Protein: 6.5 g/dL (ref 6.0–8.5)
eGFR: 92 mL/min/{1.73_m2} (ref >59)

## 2021-11-23 LAB — CBC
Hematocrit: 44.2 % (ref 37.5–51.0)
Hemoglobin: 15.4 g/dL (ref 13.0–17.7)
MCH: 33.4 pg — ABNORMAL HIGH (ref 26.6–33.0)
MCHC: 34.8 g/dL (ref 31.5–35.7)
MCV: 96 fL (ref 79–97)
Platelets: 188 10*3/uL (ref 150–450)
RBC: 4.61 x10E6/uL (ref 4.14–5.80)
RDW: 13 % (ref 11.6–15.4)
WBC: 6.5 10*3/uL (ref 3.4–10.8)

## 2021-11-23 LAB — TSH: TSH: 1.86 u[IU]/mL (ref 0.450–4.500)

## 2022-07-28 ENCOUNTER — Other Ambulatory Visit: Payer: Self-pay | Admitting: Cardiovascular Disease

## 2022-09-01 ENCOUNTER — Other Ambulatory Visit: Payer: Self-pay | Admitting: Cardiovascular Disease

## 2022-09-04 ENCOUNTER — Other Ambulatory Visit: Payer: Self-pay | Admitting: Cardiovascular Disease

## 2022-09-08 ENCOUNTER — Other Ambulatory Visit: Payer: Self-pay | Admitting: Cardiovascular Disease

## 2023-04-18 ENCOUNTER — Other Ambulatory Visit: Payer: Self-pay | Admitting: Cardiovascular Disease

## 2023-04-28 ENCOUNTER — Telehealth: Payer: Self-pay | Admitting: Cardiovascular Disease

## 2023-04-28 NOTE — Telephone Encounter (Signed)
Pt is scheduled to come in for office visit on 10/21 but pt is requesting a callback at [phone] regarding him wanting to have labs done before he comes into the office but there's no lab orders in. Please advise.

## 2023-04-28 NOTE — Telephone Encounter (Signed)
Routing to Dr Tresa Endo for lab orders

## 2023-05-03 NOTE — Telephone Encounter (Signed)
Can check CMP, CBC, TSH, fasting lipid panel.  Also in the past he had requested I check PSA and this can be checked as well if it has not been done recently.  We had previously checked LP(a) and this was normal.

## 2023-05-03 NOTE — Progress Notes (Unsigned)
Patient ID: Anthony Orr, male   DOB: 07-24-1950, 72 y.o.   MRN: [id number]       HPI: Anthony Orr is a 72 y.o. male presents to the office today for a 17 month follow-up cardiology evaluation.  In 2002, Anthony Orr underwent a routine treadmill test and developed a short burst of wide complex tachycardia. Since that time, he has been on low dose beta blocker therapy without recurrence. He has a history of hypertension as well as hyperlipidemia. An echo Doppler study in October 2008 showed normal systolic function but suggested mild mid cavity obliteration with a 10 mm pressure gradient.  Anthony Orr has remained active.  He is in the Chiropractor business. He exercises fairly regularly. He skis in the winter. He denies any palpitations.  A six-year follow-up echo Doppler study on 03/03/2014 showed mild LVH with normal systolic function with an ejection fraction of 55-60%.  He did not have regional wall motion abnormalities.  There was no mention of any mid cavity obstruction or gradient.  There was suggestion of mild grade 1 diastolic relaxation abnormality.  He had mild left atrial dilatation.  In 2016 he had undergone colonoscopy was not found to have polyps but had some gastric erosions and he has been off nonsteroidal anti-inflammatory medications.  When I saw him in December 2017 he was remaining stable.  He did not have any chest pain.  He underwent subsequent laboratory in February 2018 which showed a total cholesterol 145, triglycerides 105, HDL 53, and LDL cholesterol of 71.  He has continued to take atorvastatin 40 mg.  He also has been on amlodipine 5 mg and Toprol-XL 25 mg daily for hypertension.  He's not had any palpitations or arrhythmias.  He continues to be active.  Over the past several years.  He has skied at least 4 times per season.  He plans to ski again in the upcoming months.  He continues to play tennis at least one day per week.  He works out with a  Systems analyst several days per week.    He was evaluated by me on Nov 15, 2018 in a telemedicine visit.  At that time he stated that he had remained stable over the prior year. He underwent meniscus surgery on his left knee in July 2019  which hampered his physical activity for several once.   He is back riding bike.  He skied 1 time this year.  He applied for additional life insurance at the end of the year with 2 different companies.  He had complete of laboratory drawn at that time.  He has the results of these laboratories on his office at work he currently is at [employer] and will send me the results of his laboratory when he returns home in several weeks.  He denies chest pain PND orthopnea.  He denies palpitations.    I saw him on January 02, 2020.  At that time he continued to do well from a cardiovascular standpoint.  He was remaining active and was biking regularly as well as playing tennis.   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  Over the past year he has continued to be on amlodipine 5 mg and metoprolol succinate 25 mg daily.  He states his blood pressure typically runs around 140 systolically.  He denied palpitations, presyncope or syncope.  He has not  had recent laboratory.  During that evaluation, with his blood pressure elevation I recommended slight titration of amlodipine to 7.5 mg daily.  Follow-up laboratory was also recommended.  Laboratory January 03, 2020 showed total cholesterol was HDL 65, LDL 92 triglycerides 91.  I last saw him on Nov 19, 2021 and over the 2 prior years he continued to feel well. He has skied significantly winters.  This past [place] he [place].  He had developed a right inguinal hernia and 5 weeks ago underwent laparoscopic right hernia surgery by Dr. Derrell Lolling.  He has not yet resumed his activity but will be initiating this shortly.  He has not had  recent laboratory.  He denies any chest pain or palpitations.  Evidently he never increased his amlodipine and he continues to be on amlodipine 5 mg, metoprolol succinate 25 mg in addition to atorvastatin 40 mg.  For that evaluation, blood pressure remained elevated and I again discussed most recent hypertensive guidelines I recommended further titration of amlodipine to 7.5 mg.  Since I last saw him he has continued to remain active.  He plays tennis at least 2 days/week usually for at least 2 hours at a time he also plays golf.  He rides his bike at the beach.  He is unaware of any rhythm abnormality.  However for the past 2 to 3 months he has noticed progressive lower extremity edema right greater than left.  Denies any chest pain or exertional shortness of breath.  He does not use any sodium in his food.  He does have issues with his knees and has been followed by Dr. Renaye Rakers and has bone-on-bone and has undergone periodic cortisone injections.  It was difficult for him to get an appointment but ultimately he presents to the office today for a 27-month follow-up evaluation.  Past Medical History:  Diagnosis Date   Dyslipidemia    Hypertension     Past Surgical History:  Procedure Laterality Date   NM MYOCAR PERF WALL MOTION  [id number]   negative    No Known Allergies  Current Outpatient Medications  Medication Sig Dispense Refill   atorvastatin (LIPITOR) 40 MG tablet TAKE 1 TABLET(40 MG) BY MOUTH DAILY 90 tablet 1   diltiazem (CARDIZEM) 30 MG tablet Take 3 times a day. Take 4 times a day if heart rate is continuously high 90 tablet 3   doxycycline (VIBRA-TABS) 100 MG tablet Take 100 mg by mouth daily.     furosemide (LASIX) 20 MG tablet Take 1 tablet (20 mg total) by mouth daily. 30 tablet 0   metoprolol succinate (TOPROL-XL) 50 MG 24 hr tablet Take 1 tablet (50 mg total) by mouth daily. Take with or immediately following a meal. 90 tablet 3   apixaban (ELIQUIS) 5 MG TABS tablet Take 1  tablet (5 mg total) by mouth 2 (two) times daily. 28 tablet 0   atorvastatin (LIPITOR) 20 MG tablet Take by mouth. (Patient not taking: Reported on 05/04/2023)     No current facility-administered medications for this visit.    Social History   Socioeconomic History   Marital status: Married    Spouse name: Not on file   Number of children: Not on file   Years of education: Not on file   Highest education level: Not on file  Occupational History   Not on file  Tobacco Use   Smoking status: Former    Types: Cigarettes   Smokeless tobacco: Never  Tobacco comments:    quit in 2007  Vaping Use   Vaping status: Never Used  Substance and Sexual Activity   Alcohol use: Yes    Alcohol/week: 14.0 standard drinks of alcohol    Types: 14 Standard drinks or equivalent per week   Drug use: No   Sexual activity: Not on file  Other Topics Concern   Not on file  Social History Narrative   Not on file   Social Determinants of Health   Financial Resource Strain: Not on file  Food Insecurity: Not on file  Transportation Needs: Not on file  Physical Activity: Not on file  Stress: Not on file  Social Connections: Unknown (07/08/2022)   Received from [facility]   Social Network    Social Network: Not on file  Intimate Partner Violence: Unknown (07/08/2022)   Received from [facility], [facility]   HITS    Physically Hurt: Not on file    Insult or Talk Down To: Not on file    Threaten Physical Harm: Not on file    Scream or Curse: Not on file    Socially he is married and has 4 children and now has 9 grandchildren. He builds medical buildings. There is no tobacco use. He does drink occasional alcohol.  Family history is notable in that his father is deceased secondary to cancer.  ROS General: Negative; No fevers, chills, or night sweats;  HEENT: Negative; No changes in vision or hearing, sinus congestion, difficulty swallowing Pulmonary: Negative; No  cough, wheezing, shortness of breath, hemoptysis Cardiovascular: No chest pain or shortness of breath.  Bilateral lower extremity edema GI: Negative; No nausea, vomiting, diarrhea, or abdominal pain GU: At times he may not complete his urination and urinates frequently Musculoskeletal: Bilateral knee discomfort right greater than left Hematologic/Oncology: Negative; no easy bruising, bleeding Endocrine: Negative; no heat/cold intolerance; no diabetes Neuro: Negative; no changes in balance, headaches Skin: Surgical excision of a melanoma near his left nipple, status post resection Psychiatric: Negative; No behavioral problems, depression Sleep: Negative; No snoring, daytime sleepiness, hypersomnolence, bruxism, restless legs, hypnogognic hallucinations, no cataplexy Other comprehensive 14 point system review is negative.   PE BP 114/72 (BP Location: Left Arm, Patient Position: Sitting, Cuff Size: Normal)   Pulse 78   Ht 5\' 8"  (1.727 m)   Wt 198 lb 3.2 oz (89.9 kg)   SpO2 96%   BMI 30.14 kg/m    Repeat blood pressure by me was 140/82  Wt Readings from Last 3 Encounters:  05/04/23 198 lb 3.2 oz (89.9 kg)  11/19/21 190 lb 3.2 oz (86.3 kg)  01/02/20 196 lb 6.4 oz (89.1 kg)   General: Alert, oriented, no distress.  Skin: normal turgor, no rashes, warm and dry HEENT: Normocephalic, atraumatic. Pupils equal round and reactive to light; sclera anicteric; extraocular muscles intact; Nose without nasal septal hypertrophy Mouth/Parynx benign; Mallinpatti scale 3 Neck: No JVD, no carotid bruits; normal carotid upstroke Lungs: clear to ausculatation and percussion; no wheezing or rales Chest wall: without tenderness to palpitation; status post left nipple excision due to melanoma Heart: PMI not displaced, irregularly irregular with ventricular rate in the 70s, s1 s2 normal, 1/6 systolic murmur, no diastolic murmur, no rubs, gallops, thrills, or heaves Abdomen: soft, nontender; no  hepatosplenomehaly, BS+; abdominal aorta nontender and not dilated by palpation.  2+ lower extremity edema to the pretibial region right greater than left  Back: no CVA tenderness Pulses 2+ Musculoskeletal: full range of motion, normal  strength, no joint deformities Extremities: no clubbing cyanosis or edema, Homan's sign negative  Neurologic: grossly nonfocal; Cranial nerves grossly wnl Psychologic: Normal mood and affect  EKG Interpretation Date/Time:  Monday May 04 2023 16:27:38 EDT Ventricular Rate:  78 PR Interval:    QRS Duration:  92 QT Interval:  376 QTC Calculation: 428 R Axis:   59  Text Interpretation: Atrial fibrillation Possible Anterior infarct , age undetermined When compared with ECG of 10-Oct-2010 09:14, Atrial fibrillation has replaced Sinus rhythm Non-specific change in ST segment in Anterior leads Nonspecific T wave abnormality now evident in Anterior leads Confirmed by Nicki Guadalajara ([id number]) on 05/04/2023 6:18:15 PM    Nov 19, 2021 ECG (independently read by me): Sinus rhythm at 75, 1st degree AV block  January 01, 2021 ECG (independently read by me): Normal sinus rhythm at 60 bpm, first-degree AV block with a PR interval of 224 ms.  No ectopy.  Normal QTc interval at 384 ms.  January 2019 ECG (independently read by me): Sinus bradycardia at 52 bpm.  First degree AV block with a PR interval at 232 ms.  No significant ST-T changes.  No ectopy.  December 2017 ECG (independently read by me): Sinus bradycardia 57 bpm with first-degree AV block with a PR interval of 218 ms.  No ectopy.  No ST segment changes.  November 2017 ECG (independently read by me): Sinus bradycardia 53 bpm.  First-degree AV block.  No ST segment changes.  QTc interval normal at 382 ms.  October 2015 ECG (independently read by me): Normal sinus rhythm at 62 bpm.  Mild first-degree AV block with a PR interval 21 6/92.  Normal QTc interval at 406 ms.   August 2014ECG: Sinus rhythm at 55 beats per  minute. PR interval 202 ms. No significant ST-T changes.  LABS:     Component Value Date/Time   NA 143 11/22/2021 0823   K 4.0 11/22/2021 0823   CL 104 11/22/2021 0823   CO2 24 11/22/2021 0823   GLUCOSE 98 11/22/2021 0823   GLUCOSE 108 (H) 08/18/2016 0927   BUN 11 11/22/2021 0823   CREATININE 0.87 11/22/2021 0823   CREATININE 0.92 08/18/2016 0927   CALCIUM 9.3 11/22/2021 0823   GFRNONAA 86 01/03/2020 0919   GFRAA 100 01/03/2020 0919      Latest Ref Rng & Units 11/22/2021    8:23 AM 01/03/2020    9:19 AM 08/05/2017   12:26 PM  Hepatic Function  Total Protein 6.0 - 8.5 g/dL 6.5  6.5  7.3   Albumin 3.7 - 4.7 g/dL 4.4  4.3  4.5   AST 0 - 40 IU/L 26  30  43   ALT 0 - 44 IU/L 25  33  49   Alk Phosphatase 44 - 121 IU/L 60  61  51   Total Bilirubin 0.0 - 1.2 mg/dL 0.5  0.3  0.8       Latest Ref Rng & Units 11/22/2021    8:23 AM 01/03/2020    9:19 AM 08/05/2017   12:26 PM  CBC  WBC 3.4 - 10.8 x10E3/uL 6.5  6.9  6.7   Hemoglobin 13.0 - 17.7 g/dL 91.4  78.2  95.6   Hematocrit 37.5 - 51.0 % 44.2  45.3  47.6   Platelets 150 - 450 x10E3/uL 188  182  169    Lab Results  Component Value Date   MCV 96 11/22/2021   MCV 94 01/03/2020   MCV 94 08/05/2017   Lab  Results  Component Value Date   TSH 1.860 11/22/2021  No results found for: "HGBA1C"  Lipid Panel     Component Value Date/Time   CHOL 171 11/22/2021 0823   TRIG 62 11/22/2021 0823   HDL 84 11/22/2021 0823   CHOLHDL 2.0 11/22/2021 0823   CHOLHDL 2.7 08/18/2016 0927   VLDL 21 08/18/2016 0927   LDLCALC 75 11/22/2021 0823    RADIOLOGY: No results found.  IMPRESSION:  1. Atrial fibrillation, unspecified type (HCC)   2. Primary hypertension   3. Bilateral lower extremity edema   4. Anticoagulation management encounter   5. Pure hypercholesterolemia   6. Grade I diastolic dysfunction   7. Elevated PSA     ASSESSMENT AND PLAN: Anthony Orr is 72 year old  male who has a history of hypertension which had been  controlled with amlodipine 5 mg in addition to Toprol-XL 25 mg.  In 2002, he was found to have a burst of nonsustained ventricular tachycardia on routine treadmill testing.  He has tolerated low-dose beta blocker with any episodes of palpitations or recurrent documented arrhythmia. Prior to initiating statin therapy his total cholesterol was 228 and LDL cholesterol 177.  He has been  on statin therapy for several years and consistently is had well controlled lipid studies with LDL cholesterols in the 70s.  A lipid study in February 2018 showed a LDL of 71 with total cholesterol 145, triglycerides 105, and HDL 53.   His last echo Doppler study showed normal systolic function with a suggestion of grade 1 diastolic dysfunction.  His blood pressure today is elevated and he states at home typically his blood pressure has been running around 140 systolically.  At his office visit in June 2021 blood pressure was elevated and I discussed with him new hypertensive guidelines with ideal blood pressure less than 120/80 and stage I hypertension commencing at 130/80.  At that time I recommended further titration of amlodipine to 7.5 mg.  When seen in follow-up in May 2023 blood pressure was elevated and he had not increased the amlodipine.  Presently, I have not seen him in the office since May 2023.  He was having difficulty getting an appointment.  On exam today, he is in atrial fibrillation of questionable duration.  He was unaware that his heart rhythm was irregular.  He has continued to be active and continues to play tennis several days per week for at least 2 hours.  Blood pressure today was 140/82 and on exam he had 2+ bilateral lower extremity edema to the pretibial region.  He felt most likely that the edema may have been contributed by his bone-on-bone knees right greater than left.  His atrial fibrillation of questionable duration, I have recommended initiation of Eliquis 5 mg twice a day and have provided him with  samples to initiate therapy tonight.  With his 2+ lower extremity edema I have recommended he discontinue amlodipine.  I am recommending further titration of metoprolol to succinate to 50 mg and also have given him a prescription for diltiazem 30 mg to initiate every 8 hours but depending upon A-fib rate and blood pressure can be increased to 4 times a day or possibly 60 mg as needed.  With his significant edema I have suggested furosemide 20 mg daily for the next 3 days and depending upon response and discontinuance of amlodipine if edema continues to continue with current treatment.  I am recommending he wear support stockings with 20 to 30 mm of pressure support.  I am scheduling him for 2D echo Doppler study for assessment of LV function with his atrial fibrillation of unknown duration.  I am scheduling him to be evaluated in the atrial fibrillation clinic next week.  I did not start him on antiarrhythmic therapy today since it will be important to know with his LV function is and depending upon if he is still in A-fib I will defer this to A-fib clinic concerning initiation of antiarrhythmic treatment next week.  He most likely will need to be anticoagulated for approximately 4 weeks prior to planned cardioversion if he has not been successful with pharmacologic cardioversion.  He continues to be on atorvastatin 40 mg for hyperlipidemia.  I will see him in early December for follow-up evaluation and hopefully at that time he will have had his cardioversion and be back in sinus rhythm.  Lennette Bihari, MD, [facility]  05/04/2023 6:40 PM

## 2023-05-04 ENCOUNTER — Encounter: Payer: Self-pay | Admitting: Cardiovascular Disease

## 2023-05-04 ENCOUNTER — Ambulatory Visit: Payer: Medicare Other | Attending: Cardiovascular Disease | Admitting: Cardiovascular Disease

## 2023-05-04 ENCOUNTER — Other Ambulatory Visit: Payer: Self-pay | Admitting: Cardiovascular Disease

## 2023-05-04 DIAGNOSIS — R972 Elevated prostate specific antigen [PSA]: Secondary | ICD-10-CM | POA: Insufficient documentation

## 2023-05-04 DIAGNOSIS — Z5181 Encounter for therapeutic drug level monitoring: Secondary | ICD-10-CM | POA: Insufficient documentation

## 2023-05-04 DIAGNOSIS — I10 Essential (primary) hypertension: Secondary | ICD-10-CM | POA: Diagnosis not present

## 2023-05-04 DIAGNOSIS — E7800 Pure hypercholesterolemia, unspecified: Secondary | ICD-10-CM | POA: Diagnosis present

## 2023-05-04 DIAGNOSIS — I5189 Other ill-defined heart diseases: Secondary | ICD-10-CM | POA: Insufficient documentation

## 2023-05-04 DIAGNOSIS — R600 Localized edema: Secondary | ICD-10-CM | POA: Insufficient documentation

## 2023-05-04 DIAGNOSIS — Z7901 Long term (current) use of anticoagulants: Secondary | ICD-10-CM | POA: Insufficient documentation

## 2023-05-04 DIAGNOSIS — I4891 Unspecified atrial fibrillation: Secondary | ICD-10-CM | POA: Diagnosis not present

## 2023-05-04 MED ORDER — DILTIAZEM HCL 30 MG PO TABS: ORAL_TABLET | ORAL | 3 refills | Status: DC

## 2023-05-04 MED ORDER — FUROSEMIDE 20 MG PO TABS: 20.0000 mg | ORAL_TABLET | Freq: Every day | ORAL | 0 refills | Status: DC

## 2023-05-04 MED ORDER — METOPROLOL SUCCINATE ER 50 MG PO TB24: 50.0000 mg | ORAL_TABLET | Freq: Every day | ORAL | 3 refills | Status: DC

## 2023-05-04 MED ORDER — APIXABAN 5 MG PO TABS: 5.0000 mg | ORAL_TABLET | Freq: Two times a day (BID) | ORAL | 0 refills | Status: DC

## 2023-05-04 NOTE — Patient Instructions (Addendum)
Medication Instructions:  Increase the Metoprolol Succinate from 25mg  to 50mg .  Take the Furosemide 20mg  for 3 days for swelling. Continue to take as needed.  Wear support stockings for swelling.  *If you need a refill on your cardiac medications before your next appointment, please call your pharmacy*   Lab Work: Return for fasting labs CMET, LIPID, CBC, TSH. TSH 3, TSH 4, PSA   If you have labs (blood work) drawn today and your tests are completely normal, you will receive your results only by: MyChart Message (if you have MyChart) OR A paper copy in the mail If you have any lab test that is abnormal or we need to change your treatment, we will call you to review the results.   Testing/Procedures: Your physician has requested that you have an echocardiogram IN ONE WEEK. Echocardiography is a painless test that uses sound waves to create images of your heart. It provides your doctor with information about the size and shape of your heart and how well your heart's chambers and valves are working. This procedure takes approximately one hour. There are no restrictions for this procedure. Please do NOT wear cologne, perfume, aftershave, or lotions (deodorant is allowed).  Please arrive 15 minutes prior to your appointment time.    Follow-Up: At [facility], you and your health needs are our priority.  As part of our continuing mission to provide you with exceptional heart care, we have created designated Provider Care Teams.  These Care Teams include your primary Cardiologist (physician) and Advanced Practice Providers (APPs -  Physician Assistants and Nurse Practitioners) who all work together to provide you with the care you need, when you need it.  We recommend signing up for the patient portal called "MyChart".  Sign up information is provided on this After Visit Summary.  MyChart is used to connect with patients for Virtual Visits (Telemedicine).  Patients are able to view  lab/test results, encounter notes, upcoming appointments, etc.  Non-urgent messages can be sent to your provider as well.   To learn more about what you can do with MyChart, go to [email or web address].    Your next appointment:   2 month(s)  Provider:   Nicki Guadalajara, MD

## 2023-05-06 ENCOUNTER — Ambulatory Visit (HOSPITAL_COMMUNITY): Payer: Medicare Other | Attending: Internal Medicine

## 2023-05-06 DIAGNOSIS — I10 Essential (primary) hypertension: Secondary | ICD-10-CM

## 2023-05-06 DIAGNOSIS — I4891 Unspecified atrial fibrillation: Secondary | ICD-10-CM

## 2023-05-06 LAB — CBC
Hematocrit: 46.5 % (ref 37.5–51.0)
Hemoglobin: 15.2 g/dL (ref 13.0–17.7)
MCH: 32.1 pg (ref 26.6–33.0)
MCHC: 32.7 g/dL (ref 31.5–35.7)
MCV: 98 fL — ABNORMAL HIGH (ref 79–97)
Platelets: 155 10*3/uL (ref 150–450)
RBC: 4.74 x10E6/uL (ref 4.14–5.80)
RDW: 12.3 % (ref 11.6–15.4)
WBC: 5.2 10*3/uL (ref 3.4–10.8)

## 2023-05-06 LAB — COMPREHENSIVE METABOLIC PANEL
ALT: 26 [IU]/L (ref 0–44)
AST: 24 [IU]/L (ref 0–40)
Albumin: 4.2 g/dL (ref 3.8–4.8)
Alkaline Phosphatase: 53 [IU]/L (ref 44–121)
BUN/Creatinine Ratio: 15 (ref 10–24)
BUN: 14 mg/dL (ref 8–27)
Bilirubin Total: 0.5 mg/dL (ref 0.0–1.2)
CO2: 26 mmol/L (ref 20–29)
Calcium: 9.4 mg/dL (ref 8.6–10.2)
Chloride: 104 mmol/L (ref 96–106)
Creatinine, Ser: 0.92 mg/dL (ref 0.76–1.27)
Globulin, Total: 2.2 g/dL (ref 1.5–4.5)
Glucose: 94 mg/dL (ref 70–99)
Potassium: 4.3 mmol/L (ref 3.5–5.2)
Sodium: 142 mmol/L (ref 134–144)
Total Protein: 6.4 g/dL (ref 6.0–8.5)
eGFR: 87 mL/min/{1.73_m2} (ref >59)

## 2023-05-06 LAB — LIPID PANEL
Chol/HDL Ratio: 2.3 ratio (ref 0.0–5.0)
Cholesterol, Total: 148 mg/dL (ref 100–199)
HDL: 65 mg/dL (ref >39)
LDL Chol Calc (NIH): 70 mg/dL (ref 0–99)
Triglycerides: 65 mg/dL (ref 0–149)
VLDL Cholesterol Cal: 13 mg/dL (ref 5–40)

## 2023-05-06 LAB — ECHOCARDIOGRAM COMPLETE: S' Lateral: 2.9 cm

## 2023-05-06 LAB — PSA: Prostate Specific Ag, Serum: 1 ng/mL (ref 0.0–4.0)

## 2023-05-06 LAB — TSH+T4F+T3FREE
Free T4: 1.28 ng/dL (ref 0.82–1.77)
T3, Free: 3.2 pg/mL (ref 2.0–4.4)
TSH: 1.45 u[IU]/mL (ref 0.450–4.500)

## 2023-05-14 ENCOUNTER — Ambulatory Visit (HOSPITAL_COMMUNITY)
Admission: RE | Admit: 2023-05-14 | Discharge: 2023-05-14 | Disposition: A | Discharge status: Home / Self Care | Payer: Medicare Other | Source: Ambulatory Visit | Attending: Physician Assistant | Admitting: Physician Assistant

## 2023-05-14 VITALS — BP 158/90 | HR 84 | Ht 68.0 in | Wt 196.6 lb

## 2023-05-14 DIAGNOSIS — I459 Conduction disorder, unspecified: Secondary | ICD-10-CM | POA: Diagnosis not present

## 2023-05-14 DIAGNOSIS — R0683 Snoring: Secondary | ICD-10-CM | POA: Insufficient documentation

## 2023-05-14 DIAGNOSIS — I4819 Other persistent atrial fibrillation: Secondary | ICD-10-CM | POA: Diagnosis present

## 2023-05-14 DIAGNOSIS — Z7901 Long term (current) use of anticoagulants: Secondary | ICD-10-CM | POA: Diagnosis not present

## 2023-05-14 DIAGNOSIS — I10 Essential (primary) hypertension: Secondary | ICD-10-CM | POA: Diagnosis not present

## 2023-05-14 DIAGNOSIS — D6869 Other thrombophilia: Secondary | ICD-10-CM | POA: Insufficient documentation

## 2023-05-14 DIAGNOSIS — Z79899 Other long term (current) drug therapy: Secondary | ICD-10-CM | POA: Diagnosis not present

## 2023-05-14 DIAGNOSIS — E785 Hyperlipidemia, unspecified: Secondary | ICD-10-CM | POA: Diagnosis not present

## 2023-05-14 MED ORDER — APIXABAN 5 MG PO TABS: 5.0000 mg | ORAL_TABLET | Freq: Two times a day (BID) | ORAL | 6 refills | Status: DC

## 2023-05-14 NOTE — Progress Notes (Signed)
Primary Care Physician: Patient, No Pcp Per Primary Cardiologist: Nicki Guadalajara, MD Electrophysiologist: None  Referring Physician: Dr Deeann Dowse is a 72 y.o. 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  The patient was initially diagnosed with atrial fibrillation 05/04/23 after presenting for follow up with Dr Tresa Endo. ECG showed rate controlled afib. Patient was started on Eliquis for a CHADS2VASC score of 2.  On follow up today, patient remains in rate controlled afib. He is asymptomatic. No bleeding issues on anticoagulation. He does admit to snoring and alcohol use.   Today, he denies symptoms of palpitations, chest pain, shortness of breath, orthopnea, PND, lower extremity edema, dizziness, presyncope, syncope, bleeding, or neurologic sequela. The patient is tolerating medications without difficulties and is otherwise without complaint today.    Atrial Fibrillation Risk Factors:  he does have symptoms or diagnosis of sleep apnea. he does not have a history of rheumatic fever. he does have a history of alcohol use. The patient does have a history of early familial atrial fibrillation or other arrhythmias. Sister had afib and PPM.  Atrial Fibrillation Management history:  Previous antiarrhythmic drugs: none Previous cardioversions: none Previous ablations: none Anticoagulation history: Eliquis  ROS- All systems are reviewed and negative except as per the HPI above.  Past Medical History:  Diagnosis Date   Dyslipidemia    Hypertension     Current Outpatient Medications  Medication Sig Dispense Refill   apixaban (ELIQUIS) 5 MG TABS tablet Take 1 tablet (5 mg total) by mouth 2 (two) times daily. 28 tablet 0   atorvastatin (LIPITOR) 40 MG tablet TAKE 1 TABLET(40 MG) BY MOUTH DAILY 90 tablet 1   diltiazem (CARDIZEM) 30 MG tablet Take 3 times a day. Take 4 times a day  if heart rate is continuously high 90 tablet 3   doxycycline (VIBRA-TABS) 100 MG tablet Take 100 mg by mouth daily.     furosemide (LASIX) 20 MG tablet TAKE 1 TABLET(20 MG) BY MOUTH DAILY 90 tablet 3   metoprolol succinate (TOPROL-XL) 50 MG 24 hr tablet Take 1 tablet (50 mg total) by mouth daily. Take with or immediately following a meal. 90 tablet 3   No current facility-administered medications for this encounter.    Physical Exam: Ht 5\' 8"  (1.727 m)   Wt 89.2 kg   BMI 29.89 kg/m   GEN: Well nourished, well developed in no acute distress NECK: No JVD; No carotid bruits CARDIAC: Irregularly irregular rate and rhythm, no murmurs, rubs, gallops RESPIRATORY:  Clear to auscultation without rales, wheezing or rhonchi  ABDOMEN: Soft, non-tender, non-distended EXTREMITIES:  No edema; No deformity   Wt Readings from Last 3 Encounters:  05/14/23 89.2 kg  05/04/23 89.9 kg  11/19/21 86.3 kg     EKG today demonstrates  Afib, PVC Vent. rate 84 BPM PR interval * ms QRS duration 88 ms QT/QTcB 364/430 ms  Echo 05/06/23 demonstrated   1. Left ventricular ejection fraction, by estimation, is 55 to 60%. Left  ventricular ejection fraction by 3D volume is 57 %. The left ventricle has  normal function. The left ventricle has no regional wall motion  abnormalities. There is moderate left ventricular hypertrophy. Left ventricular diastolic function could not be evaluated.   2. Right ventricular systolic function is normal. The right ventricular  size is normal. Tricuspid regurgitation signal is inadequate for assessing  PA pressure.  3. Left atrial size was mildly dilated.   4. The mitral valve is abnormal. Mild mitral valve regurgitation.   5. The aortic valve is tricuspid. Aortic valve regurgitation is not  visualized.   6. Aortic dilatation noted. There is mild dilatation of the ascending  aorta, measuring 41 mm.   7. The inferior vena cava is dilated in size with >50% respiratory   variability, suggesting right atrial pressure of 8 mmHg.   Comparison(s): Prior images unable to be directly viewed, comparison made  by report only. Changes from prior study are noted. 03/03/2014: LVEF  55-60%.    CHA2DS2-VASc Score = 2  The patient's score is based upon: CHF History: 0 HTN History: 1 Diabetes History: 0 Stroke History: 0 Vascular Disease History: 0 Age Score: 1 Gender Score: 0       ASSESSMENT AND PLAN: Persistent Atrial Fibrillation (ICD10:  I48.19) The patient's CHA2DS2-VASc score is 2, indicating a 2.2% annual risk of stroke.   General education about afib provided and questions answered. We also discussed his stroke risk and the risks and benefits of anticoagulation. We also discussed rhythm control options. Will plan for DCCV after 3 weeks of uninterrupted anticoagulation. Can consider AAD or ablation if he has quick return of afib. Continue Eliquis 5 mg BID Continue Toprol 50 mg daily Continue diltiazem 30 mg PRN q 4 hours for heart racing  Secondary Hypercoagulable State (ICD10:  D68.69) The patient is at significant risk for stroke/thromboembolism based upon his CHA2DS2-VASc Score of 2.  Continue Apixaban (Eliquis).   HTN Elevated today, will reassess in SR  Snoring The importance of adequate treatment of sleep apnea was discussed today in order to improve our ability to maintain sinus rhythm long term. Patient's wife does say that he snores. May benefit from sleep study. Will defer to Dr Tresa Endo.    Follow up in the AF clinic post DCCV.   Informed Consent   Shared Decision Making/Informed Consent The risks (stroke, cardiac arrhythmias rarely resulting in the need for a temporary or permanent pacemaker, skin irritation or burns and complications associated with conscious sedation including aspiration, arrhythmia, respiratory failure and death), benefits (restoration of normal sinus rhythm) and alternatives of a direct current cardioversion were  explained in detail to Mr. Franc and he agrees to proceed.         Jorja Loa PA-C Afib Clinic [facility] [address] [place], [place] [address] [phone]

## 2023-05-14 NOTE — H&P (View-Only) (Signed)
 Primary Care Physician: Anthony Orr, No Pcp Per Primary Cardiologist: Nicki Guadalajara, MD Electrophysiologist: None  Referring Physician: Dr Deeann Dowse is a 72 y.o. 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  The Anthony Orr was initially diagnosed with atrial fibrillation 05/04/23 after presenting for follow up with Dr Tresa Endo. ECG showed rate controlled afib. Anthony Orr was started on Eliquis for a CHADS2VASC score of 2.  On follow up today, Anthony Orr remains in rate controlled afib. He is asymptomatic. No bleeding issues on anticoagulation. He does admit to snoring and alcohol use.   Today, he denies symptoms of palpitations, chest pain, shortness of breath, orthopnea, PND, lower extremity edema, dizziness, presyncope, syncope, bleeding, or neurologic sequela. The Anthony Orr is tolerating medications without difficulties and is otherwise without complaint today.    Atrial Fibrillation Risk Factors:  he does have symptoms or diagnosis of sleep apnea. he does not have a history of rheumatic fever. he does have a history of alcohol use. The Anthony Orr does have a history of early familial atrial fibrillation or other arrhythmias. Sister had afib and PPM.  Atrial Fibrillation Management history:  Previous antiarrhythmic drugs: none Previous cardioversions: none Previous ablations: none Anticoagulation history: Eliquis  ROS- All systems are reviewed and negative except as per the HPI above.  Past Medical History:  Diagnosis Date   Dyslipidemia    Hypertension     Current Outpatient Medications  Medication Sig Dispense Refill   apixaban (ELIQUIS) 5 MG TABS tablet Take 1 tablet (5 mg total) by mouth 2 (two) times daily. 28 tablet 0   atorvastatin (LIPITOR) 40 MG tablet TAKE 1 TABLET(40 MG) BY MOUTH DAILY 90 tablet 1   diltiazem (CARDIZEM) 30 MG tablet Take 3 times a day. Take 4 times a day  if heart rate is continuously high 90 tablet 3   doxycycline (VIBRA-TABS) 100 MG tablet Take 100 mg by mouth daily.     furosemide (LASIX) 20 MG tablet TAKE 1 TABLET(20 MG) BY MOUTH DAILY 90 tablet 3   metoprolol succinate (TOPROL-XL) 50 MG 24 hr tablet Take 1 tablet (50 mg total) by mouth daily. Take with or immediately following a meal. 90 tablet 3   No current facility-administered medications for this encounter.    Physical Exam: Ht 5\' 8"  (1.727 m)   Wt 89.2 kg   BMI 29.89 kg/m   GEN: Well nourished, well developed in no acute distress NECK: No JVD; No carotid bruits CARDIAC: Irregularly irregular rate and rhythm, no murmurs, rubs, gallops RESPIRATORY:  Clear to auscultation without rales, wheezing or rhonchi  ABDOMEN: Soft, non-tender, non-distended EXTREMITIES:  No edema; No deformity   Wt Readings from Last 3 Encounters:  05/14/23 89.2 kg  05/04/23 89.9 kg  11/19/21 86.3 kg     EKG today demonstrates  Afib, PVC Vent. rate 84 BPM PR interval * ms QRS duration 88 ms QT/QTcB 364/430 ms  Echo 05/06/23 demonstrated   1. Left ventricular ejection fraction, by estimation, is 55 to 60%. Left  ventricular ejection fraction by 3D volume is 57 %. The left ventricle has  normal function. The left ventricle has no regional wall motion  abnormalities. There is moderate left ventricular hypertrophy. Left ventricular diastolic function could not be evaluated.   2. Right ventricular systolic function is normal. The right ventricular  size is normal. Tricuspid regurgitation signal is inadequate for assessing  PA pressure.  3. Left atrial size was mildly dilated.   4. The mitral valve is abnormal. Mild mitral valve regurgitation.   5. The aortic valve is tricuspid. Aortic valve regurgitation is not  visualized.   6. Aortic dilatation noted. There is mild dilatation of the ascending  aorta, measuring 41 mm.   7. The inferior vena cava is dilated in size with >50% respiratory   variability, suggesting right atrial pressure of 8 mmHg.   Comparison(s): Prior images unable to be directly viewed, comparison made  by report only. Changes from prior study are noted. 03/03/2014: LVEF  55-60%.    CHA2DS2-VASc Score = 2  The Anthony Orr's score is based upon: CHF History: 0 HTN History: 1 Diabetes History: 0 Stroke History: 0 Vascular Disease History: 0 Age Score: 1 Gender Score: 0       ASSESSMENT AND PLAN: Persistent Atrial Fibrillation (ICD10:  I48.19) The Anthony Orr's CHA2DS2-VASc score is 2, indicating a 2.2% annual risk of stroke.   General education about afib provided and questions answered. We also discussed his stroke risk and the risks and benefits of anticoagulation. We also discussed rhythm control options. Will plan for DCCV after 3 weeks of uninterrupted anticoagulation. Can consider AAD or ablation if he has quick return of afib. Continue Eliquis 5 mg BID Continue Toprol 50 mg daily Continue diltiazem 30 mg PRN q 4 hours for heart racing  Secondary Hypercoagulable State (ICD10:  D68.69) The Anthony Orr is at significant risk for stroke/thromboembolism based upon his CHA2DS2-VASc Score of 2.  Continue Apixaban (Eliquis).   HTN Elevated today, will reassess in SR  Snoring The importance of adequate treatment of sleep apnea was discussed today in order to improve our ability to maintain sinus rhythm long term. Anthony Orr's wife does say that he snores. May benefit from sleep study. Will defer to Dr Tresa Endo.    Follow up in the AF clinic post DCCV.   Informed Consent   Shared Decision Making/Informed Consent The risks (stroke, cardiac arrhythmias rarely resulting in the need for a temporary or permanent pacemaker, skin irritation or burns and complications associated with conscious sedation including aspiration, arrhythmia, respiratory failure and death), benefits (restoration of normal sinus rhythm) and alternatives of a direct current cardioversion were  explained in detail to Mr. Franc and he agrees to proceed.         Jorja Loa PA-C Afib Clinic [facility] [address] [place], [place] [address] [phone]

## 2023-05-14 NOTE — Patient Instructions (Signed)
Cardioversion scheduled for: Tuesday, November 12th   - Arrive at the [facility] and go to admitting at 730am   - Do not eat or drink anything after midnight the night prior to your procedure.   - Take all your morning medication (except diabetic medications) with a sip of water prior to arrival.  - You will not be able to drive home after your procedure.    - Do NOT miss any doses of your blood thinner - if you should miss a dose please notify our office immediately.   - If you feel as if you go back into normal rhythm prior to scheduled cardioversion, please notify our office immediately.   If your procedure is canceled in the cardioversion suite you will be charged a cancellation fee.

## 2023-05-22 ENCOUNTER — Telehealth: Payer: Self-pay | Admitting: Cardiovascular Disease

## 2023-05-22 NOTE — Telephone Encounter (Signed)
Patient is calling to let Dr Tresa Endo know that he is taking Eliquis as directed as of Monday. He report instead of taking 5 mg twice daily he had been taking 10 mg once daily.

## 2023-05-22 NOTE — Telephone Encounter (Signed)
Pt c/o medication issue:  1. Name of Medication: apixaban (ELIQUIS) 5 MG TABS tablet   2. How are you currently taking this medication (dosage and times per day)?   Take 1 tablet (5 mg total) by mouth 2 (two) times daily    3. Are you having a reaction (difficulty breathing--STAT)? No  4. What is your medication issue? Pt calling to make provider aware that as of Monday he has been taking medication as directed versus 10 MG daily like he was. Please advise

## 2023-05-25 NOTE — Progress Notes (Signed)
Spoke to pt and instructed them to come at 0700 and to be NPO after 0000. Confirmed no missed doses of AC and instructed to take in AM with a small sip of water.   Confirmed that pt will have a ride home and someone to stay with them for 24 hours after the procedure.

## 2023-05-26 ENCOUNTER — Ambulatory Visit (HOSPITAL_COMMUNITY)
Admission: RE | Admit: 2023-05-26 | Discharge: 2023-05-26 | Disposition: A | Discharge status: Home / Self Care | Payer: Medicare Other | Attending: Internal Medicine | Admitting: Internal Medicine

## 2023-05-26 ENCOUNTER — Encounter (HOSPITAL_COMMUNITY)
Admission: RE | Disposition: A | Discharge status: Home / Self Care | Payer: Self-pay | Source: Home / Self Care | Attending: Internal Medicine

## 2023-05-26 ENCOUNTER — Ambulatory Visit (HOSPITAL_COMMUNITY): Payer: Medicare Other

## 2023-05-26 ENCOUNTER — Other Ambulatory Visit: Payer: Self-pay

## 2023-05-26 DIAGNOSIS — R0683 Snoring: Secondary | ICD-10-CM | POA: Insufficient documentation

## 2023-05-26 DIAGNOSIS — I4891 Unspecified atrial fibrillation: Secondary | ICD-10-CM | POA: Diagnosis not present

## 2023-05-26 DIAGNOSIS — I4819 Other persistent atrial fibrillation: Secondary | ICD-10-CM | POA: Diagnosis present

## 2023-05-26 DIAGNOSIS — E785 Hyperlipidemia, unspecified: Secondary | ICD-10-CM | POA: Diagnosis not present

## 2023-05-26 DIAGNOSIS — Z87891 Personal history of nicotine dependence: Secondary | ICD-10-CM | POA: Diagnosis not present

## 2023-05-26 DIAGNOSIS — D6869 Other thrombophilia: Secondary | ICD-10-CM | POA: Insufficient documentation

## 2023-05-26 DIAGNOSIS — Z79899 Other long term (current) drug therapy: Secondary | ICD-10-CM | POA: Insufficient documentation

## 2023-05-26 DIAGNOSIS — I10 Essential (primary) hypertension: Secondary | ICD-10-CM | POA: Diagnosis not present

## 2023-05-26 DIAGNOSIS — Z7901 Long term (current) use of anticoagulants: Secondary | ICD-10-CM | POA: Insufficient documentation

## 2023-05-26 HISTORY — PX: CARDIOVERSION: EP1203

## 2023-05-26 SURGERY — CARDIOVERSION (CATH LAB)
Anesthesia: General

## 2023-05-26 MED ORDER — DEXTROSE-SODIUM CHLORIDE 5-0.45 % IV SOLN
INTRAVENOUS | Status: DC
Start: 2023-05-26 — End: 2023-05-26

## 2023-05-26 MED ORDER — PROPOFOL 10 MG/ML IV BOLUS
INTRAVENOUS | Status: DC | PRN
  Administered 2023-05-26 (×2): 50 mg via INTRAVENOUS

## 2023-05-26 SURGICAL SUPPLY — 1 items: PAD DEFIB RADIO PHYSIO CONN (PAD) ×2 IMPLANT

## 2023-05-26 NOTE — Transfer of Care (Signed)
Immediate Anesthesia Transfer of Care Note  Patient: Anthony Orr  Procedure(s) Performed: CARDIOVERSION (CATH LAB)  Patient Location: Cath Lab  Anesthesia Type:General  Level of Consciousness: awake, alert , and oriented  Airway & Oxygen Therapy: Patient Spontanous Breathing and Patient connected to nasal cannula oxygen  Post-op Assessment: Report given to RN and Post -op Vital signs reviewed and stable  Post vital signs: Reviewed and stable  Last Vitals:  Vitals Value Taken Time  BP    Temp    Pulse    Resp    SpO2      Last Pain:  Vitals:   05/26/23 0707  TempSrc: Temporal         Complications: No notable events documented.

## 2023-05-26 NOTE — CV Procedure (Signed)
Procedure: Electrical Cardioversion Indications:  Atrial Fibrillation  Procedure Details:  Consent: Risks of procedure as well as the alternatives and risks of each were explained to the (patient/caregiver).  Consent for procedure obtained.  Time Out: Verified patient identification, verified procedure, site/side was marked, verified correct patient position, special equipment/implants available, medications/allergies/relevent history reviewed, required imaging and test results available. PERFORMED.  Patient placed on cardiac monitor, pulse oximetry, supplemental oxygen as necessary.  Sedation given:  propofol Pacer pads placed anterior and posterior chest.  Cardioverted 2 time(s).  Cardioversion with synchronized biphasic 200J , 360J shock.  Evaluation: Findings: Post procedure EKG shows: NSR Complications: None Patient did tolerate procedure well.  Time Spent Directly with the Patient:  15 minutes   Anthony Orr 05/26/2023, 8:21 AM

## 2023-05-26 NOTE — Anesthesia Postprocedure Evaluation (Signed)
Anesthesia Post Note  Patient: Anthony Orr  Procedure(s) Performed: CARDIOVERSION (CATH LAB)     Patient location during evaluation: PACU Anesthesia Type: General Level of consciousness: awake and alert Pain management: pain level controlled Vital Signs Assessment: post-procedure vital signs reviewed and stable Respiratory status: spontaneous breathing, nonlabored ventilation, respiratory function stable and patient connected to nasal cannula oxygen Cardiovascular status: blood pressure returned to baseline and stable Postop Assessment: no apparent nausea or vomiting Anesthetic complications: no   No notable events documented.  Last Vitals:  Vitals:   05/26/23 0855 05/26/23 0858  BP: (!) 149/98   Pulse: 72 70  Resp: 17 18  Temp:    SpO2: 96% 97%    Last Pain:  Vitals:   05/26/23 0828  TempSrc: Temporal  PainSc: 0-No pain                 [place]

## 2023-05-26 NOTE — Anesthesia Preprocedure Evaluation (Signed)
Anesthesia Evaluation  Patient identified by MRN, date of birth, ID band Patient awake    Reviewed: Allergy & Precautions, H&P , NPO status , Patient's Chart, lab work & pertinent test results  Airway Mallampati: II  TM Distance: >3 FB Neck ROM: Full    Dental no notable dental hx.    Pulmonary neg pulmonary ROS, former smoker   Pulmonary exam normal breath sounds clear to auscultation       Cardiovascular hypertension, Normal cardiovascular exam+ dysrhythmias Atrial Fibrillation  Rhythm:Regular Rate:Normal  HLD  1. Left ventricular ejection fraction, by estimation, is 55 to 60%. Left  ventricular ejection fraction by 3D volume is 57 %. The left ventricle has  normal function. The left ventricle has no regional wall motion  abnormalities. There is moderate left  ventricular hypertrophy. Left ventricular diastolic function could not be  evaluated.   2. Right ventricular systolic function is normal. The right ventricular  size is normal. Tricuspid regurgitation signal is inadequate for assessing  PA pressure.   3. Left atrial size was mildly dilated.   4. The mitral valve is abnormal. Mild mitral valve regurgitation.   5. The aortic valve is tricuspid. Aortic valve regurgitation is not  visualized.   6. Aortic dilatation noted. There is mild dilatation of the ascending  aorta, measuring 41 mm.   7. The inferior vena cava is dilated in size with >50% respiratory  variability, suggesting right atrial pressure of 8 mmHg.     Neuro/Psych negative neurological ROS  negative psych ROS   GI/Hepatic Neg liver ROS,GERD  ,,  Endo/Other  negative endocrine ROS    Renal/GU negative Renal ROS  negative genitourinary   Musculoskeletal negative musculoskeletal ROS (+)    Abdominal   Peds negative pediatric ROS (+)  Hematology negative hematology ROS (+)   Anesthesia Other Findings   Reproductive/Obstetrics negative OB  ROS                              Anesthesia Physical Anesthesia Plan  ASA: 2  Anesthesia Plan: General   Post-op Pain Management:    Induction: Intravenous  PONV Risk Score and Plan:   Airway Management Planned: Natural Airway and Nasal Cannula  Additional Equipment:   Intra-op Plan:   Post-operative Plan: Extubation in OR  Informed Consent: I have reviewed the patients History and Physical, chart, labs and discussed the procedure including the risks, benefits and alternatives for the proposed anesthesia with the patient or authorized representative who has indicated his/her understanding and acceptance.     Dental advisory given  Plan Discussed with: CRNA  Anesthesia Plan Comments:          Anesthesia Quick Evaluation

## 2023-05-26 NOTE — Interval H&P Note (Signed)
History and Physical Interval Note:  05/26/2023 7:48 AM  Anthony Orr  has presented today for surgery, with the diagnosis of AFIB.  The various methods of treatment have been discussed with the patient and family. After consideration of risks, benefits and other options for treatment, the patient has consented to  Procedure(s): CARDIOVERSION (CATH LAB) (N/A) as a surgical intervention.  The patient's history has been reviewed, patient examined, no change in status, stable for surgery.  I have reviewed the patient's chart and labs.  Questions were answered to the patient's satisfaction.     Maisie Fus

## 2023-05-27 ENCOUNTER — Encounter (HOSPITAL_COMMUNITY): Payer: Self-pay | Admitting: Internal Medicine

## 2023-05-27 NOTE — Telephone Encounter (Signed)
Patient underwent successful DC cardioversion yesterday November 12 by Dr. Carolan Clines.  The patient should be on Eliquis 5 mg twice a day not 10 mg every morning

## 2023-06-03 ENCOUNTER — Ambulatory Visit (HOSPITAL_COMMUNITY)
Admission: RE | Admit: 2023-06-03 | Discharge: 2023-06-03 | Disposition: A | Discharge status: Home / Self Care | Payer: Medicare Other | Source: Ambulatory Visit | Attending: Physician Assistant | Admitting: Physician Assistant

## 2023-06-03 ENCOUNTER — Encounter (HOSPITAL_COMMUNITY): Payer: Self-pay | Admitting: Physician Assistant

## 2023-06-03 VITALS — BP 128/90 | HR 58 | Ht 68.0 in | Wt 193.2 lb

## 2023-06-03 DIAGNOSIS — I440 Atrioventricular block, first degree: Secondary | ICD-10-CM | POA: Diagnosis not present

## 2023-06-03 DIAGNOSIS — I10 Essential (primary) hypertension: Secondary | ICD-10-CM | POA: Insufficient documentation

## 2023-06-03 DIAGNOSIS — R0683 Snoring: Secondary | ICD-10-CM | POA: Diagnosis not present

## 2023-06-03 DIAGNOSIS — Z7901 Long term (current) use of anticoagulants: Secondary | ICD-10-CM | POA: Insufficient documentation

## 2023-06-03 DIAGNOSIS — I4819 Other persistent atrial fibrillation: Secondary | ICD-10-CM | POA: Diagnosis not present

## 2023-06-03 DIAGNOSIS — E785 Hyperlipidemia, unspecified: Secondary | ICD-10-CM | POA: Diagnosis present

## 2023-06-03 DIAGNOSIS — D6869 Other thrombophilia: Secondary | ICD-10-CM | POA: Insufficient documentation

## 2023-06-03 DIAGNOSIS — I4729 Other ventricular tachycardia: Secondary | ICD-10-CM | POA: Diagnosis present

## 2023-06-03 MED ORDER — FUROSEMIDE 20 MG PO TABS: 20.0000 mg | ORAL_TABLET | Freq: Every day | ORAL | Status: DC | PRN

## 2023-06-03 MED ORDER — APIXABAN 5 MG PO TABS: 5.0000 mg | ORAL_TABLET | Freq: Two times a day (BID) | ORAL | Status: DC

## 2023-06-03 MED ORDER — DILTIAZEM HCL 30 MG PO TABS: 30.0000 mg | ORAL_TABLET | Freq: Every day | ORAL | Status: DC | PRN

## 2023-06-03 NOTE — Progress Notes (Signed)
Primary Care Physician: Patient, No Pcp Per Primary Cardiologist: Nicki Guadalajara, MD Electrophysiologist: None  Referring Physician: Dr Deeann Dowse is a 72 y.o. 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  The patient was initially diagnosed with atrial fibrillation 05/04/23 after presenting for follow up with Dr Tresa Endo. ECG showed rate controlled afib. Patient was started on Eliquis for a CHADS2VASC score of 2.  On follow up today, patient is s/p DCCV on 05/26/23. He remains in SR today. He does not feel any different in SR compared to afib. No bleeding issues on anticoagulation.   Today, he denies symptoms of palpitations, chest pain, shortness of breath, orthopnea, PND, lower extremity edema, dizziness, presyncope, syncope, bleeding, or neurologic sequela. The patient is tolerating medications without difficulties and is otherwise without complaint today.    Atrial Fibrillation Risk Factors:  he does have symptoms or diagnosis of sleep apnea. he does not have a history of rheumatic fever. he does have a history of alcohol use. The patient does have a history of early familial atrial fibrillation or other arrhythmias. Sister had afib and PPM.  Atrial Fibrillation Management history:  Previous antiarrhythmic drugs: none Previous cardioversions: 05/26/23 Previous ablations: none Anticoagulation history: Eliquis  ROS- All systems are reviewed and negative except as per the HPI above.  Past Medical History:  Diagnosis Date   Dyslipidemia    Hypertension     Current Outpatient Medications  Medication Sig Dispense Refill   apixaban (ELIQUIS) 5 MG TABS tablet Take 1 tablet (5 mg total) by mouth 2 (two) times daily. (Patient taking differently: Take 10 mg by mouth daily.) 60 tablet 6   atorvastatin (LIPITOR) 40 MG tablet TAKE 1 TABLET(40 MG) BY MOUTH DAILY 90 tablet 1    diltiazem (CARDIZEM) 30 MG tablet Take 3 times a day. Take 4 times a day if heart rate is continuously high 90 tablet 3   doxycycline (VIBRA-TABS) 100 MG tablet Take 100 mg by mouth daily.     furosemide (LASIX) 20 MG tablet TAKE 1 TABLET(20 MG) BY MOUTH DAILY 90 tablet 3   ibuprofen (ADVIL) 200 MG tablet Take 200 mg by mouth every 6 (six) hours as needed for moderate pain (pain score 4-6).     metoprolol succinate (TOPROL-XL) 50 MG 24 hr tablet Take 1 tablet (50 mg total) by mouth daily. Take with or immediately following a meal. 90 tablet 3   No current facility-administered medications for this encounter.    Physical Exam: BP (!) 128/90   Pulse (!) 58   Ht 5\' 8"  (1.727 m)   Wt 87.6 kg   BMI 29.38 kg/m   GEN: Well nourished, well developed in no acute distress NECK: No JVD; No carotid bruits CARDIAC: Regular rate and rhythm, no murmurs, rubs, gallops RESPIRATORY:  Clear to auscultation without rales, wheezing or rhonchi  ABDOMEN: Soft, non-tender, non-distended EXTREMITIES:  No edema; No deformity    Wt Readings from Last 3 Encounters:  06/03/23 87.6 kg  05/14/23 89.2 kg  05/04/23 89.9 kg     EKG today demonstrates  SB, 1st degree AV block Vent. rate 58 BPM PR interval 238 ms QRS duration 90 ms QT/QTcB 426/418 ms   Echo 05/06/23 demonstrated   1. Left ventricular ejection fraction, by estimation, is 55 to 60%. Left  ventricular ejection fraction by 3D volume is 57 %. The left ventricle  has  normal function. The left ventricle has no regional wall motion  abnormalities. There is moderate left ventricular hypertrophy. Left ventricular diastolic function could not be evaluated.   2. Right ventricular systolic function is normal. The right ventricular  size is normal. Tricuspid regurgitation signal is inadequate for assessing  PA pressure.   3. Left atrial size was mildly dilated.   4. The mitral valve is abnormal. Mild mitral valve regurgitation.   5. The aortic valve  is tricuspid. Aortic valve regurgitation is not  visualized.   6. Aortic dilatation noted. There is mild dilatation of the ascending  aorta, measuring 41 mm.   7. The inferior vena cava is dilated in size with >50% respiratory  variability, suggesting right atrial pressure of 8 mmHg.   Comparison(s): Prior images unable to be directly viewed, comparison made  by report only. Changes from prior study are noted. 03/03/2014: LVEF  55-60%.    CHA2DS2-VASc Score = 2  The patient's score is based upon: CHF History: 0 HTN History: 1 Diabetes History: 0 Stroke History: 0 Vascular Disease History: 0 Age Score: 1 Gender Score: 0       ASSESSMENT AND PLAN: Persistent Atrial Fibrillation (ICD10:  I48.19) The patient's CHA2DS2-VASc score is 2, indicating a 2.2% annual risk of stroke.   S/p DCCV 05/26/23 Patient appears to be maintaining SR.  Can consider AAD or ablation if he has quick return of afib. Although, given his paucity of symptoms he may elect to pursue rate control only.  Continue Eliquis 5 mg BID Continue Toprol 50 mg daily Continue diltiazem 30 mg PRN q 4 hours for heart racing  Secondary Hypercoagulable State (ICD10:  D68.69) The patient is at significant risk for stroke/thromboembolism based upon his CHA2DS2-VASc Score of 2.  Continue Apixaban (Eliquis).   HTN Stable on current regimen  Snoring The importance of adequate treatment of sleep apnea was discussed today in order to improve our ability to maintain sinus rhythm long term. Patient's wife does say that he snores. May benefit from sleep study. Will defer to Dr Tresa Endo.    Follow up with Dr Tresa Endo in 3 months.     Jorja Loa PA-C [facility] [address] [place], [place] [address] [phone]

## 2023-06-03 NOTE — Patient Instructions (Signed)
Great to see you today!!!  Make sure to take your Eliquis 5mg  Twice daily   Take Diltiazem and Furosemide only AS NEEDED  Your physician recommends that you schedule a follow-up appointment with Dr Tresa Endo at [facility] in 3 months, they will call you for an appointment

## 2023-06-15 ENCOUNTER — Telehealth: Payer: Self-pay | Admitting: Cardiovascular Disease

## 2023-06-15 NOTE — Telephone Encounter (Signed)
Patient is calling stating he received a call that he is needing a 3 month f/u scheduled with Dr. Tresa Endo from his procedure. He is requesting a callback from his nurse to be worked in stating he does not need to wait until the April schedule is opened to be seen by him. Please advise.

## 2023-06-22 ENCOUNTER — Telehealth: Payer: Self-pay | Admitting: Cardiovascular Disease

## 2023-06-22 NOTE — Telephone Encounter (Signed)
-----   Message from Nurse Herbert Seta S sent at 06/03/2023 11:27 AM EST ----- Pt needs f/u with Dr Tresa Endo in 3 months please, thanks

## 2023-06-22 NOTE — Telephone Encounter (Signed)
LVM 3x to schedule follow up with Dr. Tresa Endo in 3 months. Will send mychart message to pt.

## 2023-06-22 NOTE — Telephone Encounter (Signed)
OK to add onto my schedule for a 3 month appointment.

## 2023-06-24 ENCOUNTER — Telehealth: Payer: Self-pay | Admitting: *Deleted

## 2023-06-24 NOTE — Telephone Encounter (Signed)
   Pre-operative Risk Assessment    Patient Name: Anthony Orr  DOB: July 17, 1950 MRN: [id number]  DATE OF LAST VISIT: 05/04/23 DR. KELLY DATE OF NEXT VISIT: 09/22/23 DR. KELLY    Request for Surgical Clearance    Procedure:   RIGHT TOTAL KNEE ARTHROPLASTY  Date of Surgery:  Clearance TBD (APRIL 2025)                                 Surgeon:  DR. Margarita Rana Surgeon's Group or Practice Name:  [facility] Phone number:  [phone] ATTN: KELLY HIGH EXT 3134 Fax number:  [phone]   Type of Clearance Requested:   - Medical  - Pharmacy:  Hold Apixaban (Eliquis)     Type of Anesthesia:  Spinal   Additional requests/questions:    Elpidio Anis   06/24/2023, 12:21 PM

## 2023-06-24 NOTE — Telephone Encounter (Signed)
Pharmacy please advise on holding Eliquis prior to right total knee arthroplasty scheduled for 10/2023. Thank you.

## 2023-06-24 NOTE — Telephone Encounter (Signed)
   Name: Anthony Orr  DOB: 08/31/1950  MRN: [id number]  Primary Cardiologist: Nicki Guadalajara, MD  Chart reviewed as part of pre-operative protocol coverage. The patient has an upcoming visit scheduled with Dr. Tresa Endo  on 10/05/2023 at which time clearance can be addressed in case there are any issues that would impact surgical recommendations.  I added preop FYI to appointment note so that provider is aware to address at time of outpatient visit.  Per office protocol the cardiology provider should forward their finalized clearance decision and recommendations regarding antiplatelet therapy to the requesting party below.    This message will also be routed to pharmacy pool for input on holding Eliquis as requested below so that this information is available to the clearing provider at time of patient's appointment.   I will route this message as FYI to requesting party and remove this message from the preop box as separate preop APP input not needed at this time.   Please call with any questions.  Napoleon Form, Leodis Rains, NP  06/24/2023, 12:31 PM

## 2023-06-26 NOTE — Telephone Encounter (Signed)
Patient with diagnosis of afib on Eliquis for anticoagulation.    Procedure: RIGHT TOTAL KNEE ARTHROPLASTY  Date of procedure: TBD (April)   CHA2DS2-VASc Score = 2   This indicates a 2.2% annual risk of stroke. The patient's score is based upon: CHF History: 0 HTN History: 1 Diabetes History: 0 Stroke History: 0 Vascular Disease History: 0 Age Score: 1 Gender Score: 0      CrCl 75.8 ml/min Platelet count 155  Per office protocol, patient can hold Eliquis for 3 days prior to procedure.    **This guidance is not considered finalized until pre-operative APP has relayed final recommendations.**

## 2023-06-26 NOTE — Telephone Encounter (Signed)
Called patient to schedule telephone visit for clearance patient answered but was busy at the moment will call back to schedule.

## 2023-06-26 NOTE — Telephone Encounter (Signed)
   Name: Anthony Orr  DOB: 11/26/1950  MRN: [id number]  Primary Cardiologist: Nicki Guadalajara, MD   Preoperative team, please contact this patient and set up a phone call appointment for further preoperative risk assessment. Please obtain consent and complete medication review. Thank you for your help. Last seen October 2024.   I confirm that guidance regarding antiplatelet and oral anticoagulation therapy has been completed and, if necessary, noted below.  Per office protocol, patient can hold Eliquis for 3 days prior to procedure.    I also confirmed the patient resides in the state of [place]. As per [facility] Medical Board telemedicine laws, the patient must reside in the state in which the provider is licensed.   Joni Reining, NP 06/26/2023, 12:48 PM [facility]

## 2023-07-01 NOTE — Telephone Encounter (Signed)
Per Robin Searing, NP pt is scheduled to see Dr. Tresa Endo on 10/05/23 in office and clearance will be addressed at visit

## 2023-09-16 ENCOUNTER — Telehealth: Payer: Self-pay

## 2023-09-16 NOTE — Telephone Encounter (Signed)
 Received a call from Dr. Tresa Endo this morning requesting a surgical clearance for patient Anthony Orr. He is scheduled to have knee surgery in April 2025. Dr. Tresa Endo has recommended that this patient hold Eliquis 48 hours prior to the surgical procedure. Contacting [facility] to request document for official clearance and doctors signature.

## 2023-09-16 NOTE — Telephone Encounter (Signed)
 Spoke with Roanna Raider, surgery scheduler at [facility]. She reports that clearance form was faxed on 06/24/23.   Reviewed chart and confirmed pre-op clearance documentation was initiated and plan is for pt to see Dr. Tresa Endo this month for clearance. Appointment is already scheduled for 09/23/23. Will request that Dr. Tresa Endo complete preop clearance documentation at that time.

## 2023-09-21 NOTE — Telephone Encounter (Signed)
 Doctor has been made aware that patient rescheduled this appointment.

## 2023-09-22 ENCOUNTER — Ambulatory Visit: Payer: Medicare Other | Admitting: Cardiovascular Disease

## 2023-09-23 ENCOUNTER — Ambulatory Visit: Payer: Medicare Other | Admitting: Cardiovascular Disease

## 2023-10-07 ENCOUNTER — Ambulatory Visit: Attending: Cardiovascular Disease | Admitting: Cardiovascular Disease

## 2023-10-07 ENCOUNTER — Encounter: Payer: Self-pay | Admitting: Cardiovascular Disease

## 2023-10-07 ENCOUNTER — Encounter: Payer: Self-pay | Admitting: Pharmacist

## 2023-10-07 VITALS — BP 126/78 | HR 64 | Ht 68.0 in | Wt 190.6 lb

## 2023-10-07 DIAGNOSIS — I480 Paroxysmal atrial fibrillation: Secondary | ICD-10-CM | POA: Diagnosis present

## 2023-10-07 DIAGNOSIS — D6869 Other thrombophilia: Secondary | ICD-10-CM | POA: Diagnosis present

## 2023-10-07 DIAGNOSIS — I10 Essential (primary) hypertension: Secondary | ICD-10-CM | POA: Diagnosis present

## 2023-10-07 DIAGNOSIS — E7800 Pure hypercholesterolemia, unspecified: Secondary | ICD-10-CM | POA: Diagnosis present

## 2023-10-07 DIAGNOSIS — Z01818 Encounter for other preprocedural examination: Secondary | ICD-10-CM | POA: Diagnosis present

## 2023-10-07 DIAGNOSIS — I4819 Other persistent atrial fibrillation: Secondary | ICD-10-CM | POA: Diagnosis present

## 2023-10-07 NOTE — Patient Instructions (Signed)
 Medication Instructions:  You may take the Eliquis 5mg  until 10/18/2023.  HOLD the Eliquis 3 days prior to your knee surgery on 10/22/2023.  Speak with the surgeon regarding resuming the Eliquis 5mg .  Once you resume the Eliquis, make sure you are taking one tablet twice a day.   *If you need a refill on your cardiac medications before your next appointment, please call your pharmacy*   Lab Work: No labs were ordered during today's visit.  If you have labs (blood work) drawn today and your tests are completely normal, you will receive your results only by: MyChart Message (if you have MyChart) OR A paper copy in the mail If you have any lab test that is abnormal or we need to change your treatment, we will call you to review the results.   Testing/Procedures: No procedures were ordered during today's visit.    Follow-Up: At [facility], you and your health needs are our priority.  As part of our continuing mission to provide you with exceptional heart care, we have created designated Provider Care Teams.  These Care Teams include your primary Cardiologist (physician) and Advanced Practice Providers (APPs -  Physician Assistants and Nurse Practitioners) who all work together to provide you with the care you need, when you need it.  We recommend signing up for the patient portal called "MyChart".  Sign up information is provided on this After Visit Summary.  MyChart is used to connect with patients for Virtual Visits (Telemedicine).  Patients are able to view lab/test results, encounter notes, upcoming appointments, etc.  Non-urgent messages can be sent to your provider as well.   To learn more about what you can do with MyChart, go to [email or web address].    Your next appointment:   6-4month(s)  Provider:   Dr. Thurmon Fair     Other Instructions [facility]  [address] [place], [place] [address] OPENING APRIL [phone]       1st  Floor: - Lobby - Registration  - Pharmacy  - Lab - Cafe   2nd Floor: - PV Lab - Diagnostic Testing (echo, CT, nuclear med)   3rd Floor: - Vacant   4th Floor: - TCTS (cardiothoracic surgery) - AFib Clinic - Structural Heart Clinic - Vascular Surgery  - Vascular Ultrasound   5th Floor: - HeartCare Cardiology (general and EP) - Clinical Pharmacy for coumadin, hypertension, lipid, weight-loss medications, and med management appointments      Valet parking services will be available as well.       Thank you for choosing [facility]!

## 2023-10-07 NOTE — Progress Notes (Unsigned)
 Patient ID: Anthony Orr, male   DOB: Jun 26, 1951, 73 y.o.   MRN: [id number]       HPI: Anthony Orr is a 73 y.o. male presents to the office today for a 5 month follow-up cardiology and preoperative evaluation prior to undergoing knee surgery.  In 2002, Anthony Orr underwent a routine treadmill test and developed a short burst of wide complex tachycardia. Since that time, he has been on low dose beta blocker therapy without recurrence. He has a history of hypertension as well as hyperlipidemia. An echo Doppler study in October 2008 showed normal systolic function but suggested mild mid cavity obliteration with a 10 mm pressure gradient.  Anthony Orr has remained active.  He is in the Chiropractor business. He exercises fairly regularly. He skis in the winter. He denies any palpitations.  A six-year follow-up echo Doppler study on 03/03/2014 showed mild LVH with normal systolic function with an ejection fraction of 55-60%.  He did not have regional wall motion abnormalities.  There was no mention of any mid cavity obstruction or gradient.  There was suggestion of mild grade 1 diastolic relaxation abnormality.  He had mild left atrial dilatation.  In 2016 he had undergone colonoscopy was not found to have polyps but had some gastric erosions and he has been off nonsteroidal anti-inflammatory medications.  When I saw him in December 2017 he was remaining stable.  He did not have any chest pain.  He underwent subsequent laboratory in February 2018 which showed a total cholesterol 145, triglycerides 105, HDL 53, and LDL cholesterol of 71.  He has continued to take atorvastatin 40 mg.  He also has been on amlodipine 5 mg and Toprol-XL 25 mg daily for hypertension.  He's not had any palpitations or arrhythmias.  He continues to be active.  Over the past several years.  He has skied at least 4 times per season.  He plans to ski again in the upcoming months.  He continues to play tennis at  least one day per week.  He works out with a Systems analyst several days per week.    He was evaluated by me on Nov 15, 2018 in a telemedicine visit.  At that time he stated that he had remained stable over the prior year. He underwent meniscus surgery on his left knee in July 2019  which hampered his physical activity for several once.   He is back riding bike.  He skied 1 time this year.  He applied for additional life insurance at the end of the year with 2 different companies.  He had complete of laboratory drawn at that time.  He has the results of these laboratories on his office at work he currently is at [employer] and will send me the results of his laboratory when he returns home in several weeks.  He denies chest pain PND orthopnea.  He denies palpitations.    I saw him on January 02, 2020.  At that time he continued to do well from a cardiovascular standpoint.  He was remaining active and was biking regularly as well as playing tennis.   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  Over the past year he has continued to be on amlodipine 5 mg and metoprolol succinate 25 mg daily.  He states his blood pressure typically runs around 140 systolically.  He denied palpitations,  presyncope or syncope.  He has not had recent laboratory.  During that evaluation, with his blood pressure elevation I recommended slight titration of amlodipine to 7.5 mg daily.  Follow-up laboratory was also recommended.  Laboratory January 03, 2020 showed total cholesterol was HDL 65, LDL 92 triglycerides 91.  I saw him on Nov 19, 2021 and over the 2 prior years he continued to feel well. He has skied significantly winters.  This past winter he he has [place] over 40 [place].  He had developed a right inguinal hernia and 5 weeks ago underwent laparoscopic right hernia surgery by Dr. Derrell Lolling.  He has not yet resumed his activity but will be  initiating this shortly.  He has not had recent laboratory.  He denies any chest pain or palpitations.  Evidently he never increased his amlodipine and he continues to be on amlodipine 5 mg, metoprolol succinate 25 mg in addition to atorvastatin 40 mg.  For that evaluation, blood pressure remained elevated and I again discussed most recent hypertensive guidelines I recommended further titration of amlodipine to 7.5 mg.  I last saw him on May 04 2023.  Since his prior evaluation he has continued to remain active.  He plays tennis at least 2 days/week usually for at least 2 hours at a time he also plays golf.  He rides his bike at the beach.  He is unaware of any rhythm abnormality.  However for the past 2 to 3 months he has noticed progressive lower extremity edema right greater than left.  He denies any chest pain or exertional shortness of breath.  He does not use any sodium in his food.  He does have issues with his knees and has been followed by Dr. Renaye Rakers and has bone-on-bone and has undergone periodic cortisone injections.  It was difficult for him to get an appointment but ultimately he presents to the office today for a 58-month follow-up evaluation.  During my May 04, 2023 evaluation, he was found to be in atrial fibrillation for which he was totally unaware.  On exam, he did have 2+ bilateral lower extremity edema.  His ECG showed atrial fibrillation with ventricular rate at 78 bpm.  With his significant edema, at that time I recommended he discontinue amlodipine.  I recommended further titration of metoprolol succinate to 50 mg and gave him a prescription for diltiazem 30 mg to take every 8 hours but depending upon his atrial fibrillation rate and blood pressure this could be further increased.  I added furosemide 20 mg daily for the next 3 days.  I recommended he wear support stockings with 20 to 30 mm of pressure support.  He was scheduled for 2D echo Doppler study with his atrial  fibrillation of unknown duration.  I recommended initiation of anticoagulation with Eliquis 5 mg twice a day.  Discussed anticoagulation for minimum of 4 weeks prior to probable planned future cardioversion.  Doppler study was done on May 06, 2023 which showed EF 55 to 60% (57% by 3D volume).  LA was mildly dilated.  There was mild MR.  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  Since that evaluation, he was seen in the atrial fibrillation clinic [facility], [place] on May 14, 2023.  On May 26, 2023 he underwent successful outpatient DC cardioversion with restoration of sinus rhythm.  Presently, he has not been aware of any recurrent arrhythmia.  He has an Scientist, physiological and has not noticed any recurrent  A-fib.  He underwent extensive travel in January and did well.  He also has gone skiing out west 3 times this winter.  He has bone-on-bone of both knees.  He is tentatively scheduled to undergo right knee replacement surgery by Dr. Renaye Rakers on October 22, 2022.  He remains active.  He denies any recurrent leg swelling.  Presently he is apparently back on amlodipine 5 mg, has not needed to take furosemide since his edema resolves, and is on metoprolol succinate 50 mg daily.  He is supposed to be taking Eliquis 5 mg twice a day but recently has only been taking 5 mg daily.  He also is on atorvastatin 40 mg.  He presents for follow-up evaluation and preoperative clearance.   Past Medical History:  Diagnosis Date   Dyslipidemia    Hypertension     Past Surgical History:  Procedure Laterality Date   CARDIOVERSION N/A 05/26/2023   Procedure: CARDIOVERSION (CATH LAB);  Surgeon: Maisie Fus, MD;  Location: [facility] INVASIVE CV LAB;  Service: Cardiovascular;  Laterality: N/A;   NM MYOCAR PERF WALL MOTION  [id number]   negative    No Known Allergies  Current Outpatient Medications  Medication Sig Dispense Refill   amLODipine (NORVASC) 5 MG tablet Take 5 mg by mouth daily.     apixaban  (ELIQUIS) 5 MG TABS tablet Take 1 tablet (5 mg total) by mouth 2 (two) times daily.     atorvastatin (LIPITOR) 40 MG tablet TAKE 1 TABLET(40 MG) BY MOUTH DAILY 90 tablet 1   doxycycline (VIBRA-TABS) 100 MG tablet Take 100 mg by mouth daily.     furosemide (LASIX) 20 MG tablet Take 1 tablet (20 mg total) by mouth daily as needed.     ibuprofen (ADVIL) 200 MG tablet Take 200 mg by mouth every 6 (six) hours as needed for moderate pain (pain score 4-6).     metoprolol succinate (TOPROL-XL) 50 MG 24 hr tablet Take 1 tablet (50 mg total) by mouth daily. Take with or immediately following a meal. 90 tablet 3   No current facility-administered medications for this visit.    Social History   Socioeconomic History   Marital status: Married    Spouse name: Not on file   Number of children: Not on file   Years of education: Not on file   Highest education level: Not on file  Occupational History   Not on file  Tobacco Use   Smoking status: Former    Types: Cigarettes   Smokeless tobacco: Never   Tobacco comments:    Former smoker (quit in 2007) 06/03/23  Vaping Use   Vaping status: Never Used  Substance and Sexual Activity   Alcohol use: Yes    Alcohol/week: 14.0 standard drinks of alcohol    Types: 14 Standard drinks or equivalent per week   Drug use: No   Sexual activity: Not on file  Other Topics Concern   Not on file  Social History Narrative   Not on file   Social Drivers of Health   Financial Resource Strain: Not on file  Food Insecurity: Low Risk  (09/11/2023)   Received from [facility]   Hunger Vital Sign    Worried About Running Out of Food in the Last Year: Never true    Ran Out of Food in the Last Year: Never true  Transportation Needs: No Transportation Needs (09/11/2023)   Received from [facility]    In the past 12  months, has lack of reliable transportation kept you from medical appointments, meetings, work or from getting things needed for  daily living? : No  Physical Activity: Not on file  Stress: Not on file  Social Connections: Unknown (07/08/2022)   Received from [facility], [facility]   Social Network    Social Network: Not on file  Intimate Partner Violence: Unknown (07/08/2022)   Received from [facility], [facility]   HITS    Physically Hurt: Not on file    Insult or Talk Down To: Not on file    Threaten Physical Harm: Not on file    Scream or Curse: Not on file    Socially he is married and has 4 children and now has 9 grandchildren. He builds medical buildings. There is no tobacco use. He does drink occasional alcohol.  Family history is notable in that his father is deceased secondary to cancer.  ROS General: Negative; No fevers, chills, or night sweats;  HEENT: Negative; No changes in vision or hearing, sinus congestion, difficulty swallowing Pulmonary: Negative; No cough, wheezing, shortness of breath, hemoptysis Cardiovascular: No chest pain or shortness of breath.  Bilateral lower extremity edema GI: Negative; No nausea, vomiting, diarrhea, or abdominal pain GU: At times he may not complete his urination and urinates frequently Musculoskeletal: Bilateral knee discomfort right greater than left Hematologic/Oncology: Negative; no easy bruising, bleeding Endocrine: Negative; no heat/cold intolerance; no diabetes Neuro: Negative; no changes in balance, headaches Skin: Surgical excision of a melanoma near his left nipple, status post resection Psychiatric: Negative; No behavioral problems, depression Sleep: Negative; No snoring, daytime sleepiness, hypersomnolence, bruxism, restless legs, hypnogognic hallucinations, no cataplexy Other comprehensive 14 point system review is negative.   PE BP 126/78   Pulse 64   Ht 5\' 8"  (1.727 m)   Wt 190 lb 9.6 oz (86.5 kg)   SpO2 96%   BMI 28.98 kg/m    Repeat blood pressure by me was 128/76  Wt Readings from Last 3 Encounters:  10/07/23 190 lb  9.6 oz (86.5 kg)  06/03/23 193 lb 3.2 oz (87.6 kg)  05/14/23 196 lb 9.6 oz (89.2 kg)   General: Alert, oriented, no distress.  Skin: normal turgor, no rashes, warm and dry HEENT: Normocephalic, atraumatic. Pupils equal round and reactive to light; sclera anicteric; extraocular muscles intact;  Nose without nasal septal hypertrophy Mouth/Parynx benign; Mallinpatti scale 3 Neck: No JVD, no carotid bruits; normal carotid upstroke Lungs: clear to ausculatation and percussion; no wheezing or rales Chest wall: without tenderness to palpitation Heart: PMI not displaced, RRR, s1 s2 normal, 1/6 systolic murmur, no diastolic murmur, no rubs, gallops, thrills, or heaves Abdomen: soft, nontender; no hepatosplenomehaly, BS+; abdominal aorta nontender and not dilated by palpation. Back: no CVA tenderness Pulses 2+ Musculoskeletal: full range of motion, normal strength, no joint deformities Extremities: Resolution of prior no clubbing cyanosis, Homan's sign negative  Neurologic: grossly nonfocal; Cranial nerves grossly wnl Psychologic: Normal mood and affect   EKG Interpretation Date/Time:  Wednesday October 07 2023 08:48:20 EDT Ventricular Rate:  64 PR Interval:  242 QRS Duration:  88 QT Interval:  392 QTC Calculation: 404 R Axis:   3  Text Interpretation: Sinus rhythm with 1st degree A-V block When compared with ECG of 03-Jun-2023 11:03, No significant change was found Confirmed by Nicki Guadalajara ([id number]) on 10/08/2023 3:26:01 PM    May 04, 2023 ECG (independently read by me): Atrial fibrillation at 78 bpm.  Nonspecific ST changes.  Nov 19, 2021  ECG (independently read by me): Sinus rhythm at 75, 1st degree AV block  January 01, 2021 ECG (independently read by me): Normal sinus rhythm at 60 bpm, first-degree AV block with a PR interval of 224 ms.  No ectopy.  Normal QTc interval at 384 ms.  January 2019 ECG (independently read by me): Sinus bradycardia at 52 bpm.  First degree AV block with a PR  interval at 232 ms.  No significant ST-T changes.  No ectopy.  December 2017 ECG (independently read by me): Sinus bradycardia 57 bpm with first-degree AV block with a PR interval of 218 ms.  No ectopy.  No ST segment changes.  November 2017 ECG (independently read by me): Sinus bradycardia 53 bpm.  First-degree AV block.  No ST segment changes.  QTc interval normal at 382 ms.  October 2015 ECG (independently read by me): Normal sinus rhythm at 62 bpm.  Mild first-degree AV block with a PR interval 21 6/92.  Normal QTc interval at 406 ms.   August 2014ECG: Sinus rhythm at 55 beats per minute. PR interval 202 ms. No significant ST-T changes.  LABS:     Component Value Date/Time   NA 142 05/05/2023 1040   K 4.3 05/05/2023 1040   CL 104 05/05/2023 1040   CO2 26 05/05/2023 1040   GLUCOSE 94 05/05/2023 1040   GLUCOSE 108 (H) 08/18/2016 0927   BUN 14 05/05/2023 1040   CREATININE 0.92 05/05/2023 1040   CREATININE 0.92 08/18/2016 0927   CALCIUM 9.4 05/05/2023 1040   GFRNONAA 86 01/03/2020 0919   GFRAA 100 01/03/2020 0919      Latest Ref Rng & Units 05/05/2023   10:40 AM 11/22/2021    8:23 AM 01/03/2020    9:19 AM  Hepatic Function  Total Protein 6.0 - 8.5 g/dL 6.4  6.5  6.5   Albumin 3.8 - 4.8 g/dL 4.2  4.4  4.3   AST 0 - 40 IU/L 24  26  30    ALT 0 - 44 IU/L 26  25  33   Alk Phosphatase 44 - 121 IU/L 53  60  61   Total Bilirubin 0.0 - 1.2 mg/dL 0.5  0.5  0.3       Latest Ref Rng & Units 05/05/2023   10:40 AM 11/22/2021    8:23 AM 01/03/2020    9:19 AM  CBC  WBC 3.4 - 10.8 x10E3/uL 5.2  6.5  6.9   Hemoglobin 13.0 - 17.7 g/dL 54.0  98.1  19.1   Hematocrit 37.5 - 51.0 % 46.5  44.2  45.3   Platelets 150 - 450 x10E3/uL 155  188  182    Lab Results  Component Value Date   MCV 98 (H) 05/05/2023   MCV 96 11/22/2021   MCV 94 01/03/2020   Lab Results  Component Value Date   TSH 1.450 05/05/2023  No results found for: "HGBA1C"  Lipid Panel     Component Value Date/Time   CHOL  148 05/05/2023 1040   TRIG 65 05/05/2023 1040   HDL 65 05/05/2023 1040   CHOLHDL 2.3 05/05/2023 1040   CHOLHDL 2.7 08/18/2016 0927   VLDL 21 08/18/2016 0927   LDLCALC 70 05/05/2023 1040    RADIOLOGY: No results found.  IMPRESSION:  1. Pre-operative clearance   2. Essential hypertension   3. Paroxysmal atrial fibrillation (HCC)   4. Hypercoagulable state due to persistent atrial fibrillation (HCC)   5. Pure hypercholesterolemia     ASSESSMENT AND PLAN:  Mr. Marqueze Ramcharan is 73 year old  male who has a history of hypertension which had been controlled with amlodipine 5 mg in addition to Toprol-XL 25 mg.  In 2002, he was found to have a burst of nonsustained ventricular tachycardia on routine treadmill testing.  He has tolerated low-dose beta blocker with any episodes of palpitations or recurrent documented arrhythmia. Prior to initiating statin therapy his total cholesterol was 228 and LDL cholesterol 177.  He has been  on statin therapy for several years and consistently is had well controlled lipid studies with LDL cholesterols in the 70s.  A lipid study in February 2018 showed a LDL of 71 with total cholesterol 145, triglycerides 105, and HDL 53.   His last echo Doppler study showed normal systolic function with a suggestion of grade 1 diastolic dysfunction.  His blood pressure today is elevated and he states at home typically his blood pressure has been running around 140 systolically.  At his office visit in June 2021 blood pressure was elevated and I discussed with him new hypertensive guidelines with ideal blood pressure less than 120/80 and stage I hypertension commencing at 130/80.  At that time I recommended further titration of amlodipine to 7.5 mg.  Not seen him since May 2023 and last saw him on May 04, 2023.  At that time he was found to be in atrial fibrillation of questionable duration.  He was completely unaware of this arrhythmia.  He also had 2+ bilateral lower extremity  edema.  He was started on Eliquis anticoagulation, amlodipine was discontinued and he was given a prescription for diltiazem 30 mg every 8 hours and I recommended further titration of metoprolol succinate to 50 mg.  He was given a prescription for furosemide 20 mg for his leg edema.  A 2D echo Doppler study showed normal LV function 5 to 60%.  There was moderate LVH and mild LA dilation with mild MR.  There was very mild dilation of ascending aorta at 41 mm.  Following anticoagulation for approximately a month, he underwent successful DC cardioversion by Dr. Carolan Clines on May 26, 2023.  Subsequently, he has been maintaining sinus rhythm.  He has continued to take Eliquis.  He went on an extensive trip to the [place], and also has skied at least 3 times out west this winter.  He is having increasing pain with his bone-on-bone knees bilaterally.  He is now scheduled to undergo elective right knee replacement surgery by Dr. Renaye Rakers on October 22, 2023.  Presently on exam he is maintaining sinus rhythm.  EKG today is normal.  Blood pressure is stable.  He tells me he is now back taking amlodipine and has not taken diltiazem.  He is not use furosemide since his edema resolved.  However, he apparently has self reduced his Eliquis to just 5 mg daily.  I discussed with him today the importance of taking Eliquis 5 mg twice a day since technically he is not therapeutic and if he does have recurrent AF there is increase stroke risk.  Given him clearance to undergo planned knee surgery on October 22, 2023.  He will take his last dose of Eliquis 5 mg twice a day on April 6, and not take for 3 days prior to surgery.  If surgery is stable without significant bleeding issues, he can resume Eliquis 1 day following surgery.  Is aware of my upcoming retirement I will see him prior to June if sorry.  But as long as  he remains stable, I will transition him to the care of Dr. Royann Shivers seen in 6 months or sooner as needed.  I  reviewed his most recent laboratory which was done when he established with a new primary provider Doylene Canning, FNP [facility].  Following his recent trips, LDL had increased from 70 up to 86 cholesterol had increased from 1 48-1 78.  He continues to be on atorvastatin 40 mg daily.  Repeat laboratory should be obtained in the future for reassessment.    Lennette Bihari, MD, [facility]  10/08/2023 4:04 PM

## 2023-10-08 ENCOUNTER — Encounter: Payer: Self-pay | Admitting: Cardiovascular Disease

## 2023-10-12 ENCOUNTER — Other Ambulatory Visit: Payer: Self-pay | Admitting: Cardiovascular Disease

## 2023-11-10 ENCOUNTER — Other Ambulatory Visit: Payer: Self-pay | Admitting: Cardiovascular Disease

## 2024-03-23 ENCOUNTER — Encounter: Payer: Self-pay | Admitting: Cardiovascular Disease

## 2024-05-09 ENCOUNTER — Other Ambulatory Visit: Payer: Self-pay

## 2024-05-10 MED ORDER — METOPROLOL SUCCINATE ER 50 MG PO TB24: 50.0000 mg | ORAL_TABLET | Freq: Every day | ORAL | 1 refills | Status: DC

## 2024-05-31 ENCOUNTER — Other Ambulatory Visit: Payer: Self-pay

## 2024-05-31 DIAGNOSIS — I4819 Other persistent atrial fibrillation: Secondary | ICD-10-CM

## 2024-06-03 MED ORDER — APIXABAN 5 MG PO TABS: 5.0000 mg | ORAL_TABLET | Freq: Two times a day (BID) | ORAL | 5 refills | Status: DC

## 2024-06-03 MED ORDER — METOPROLOL SUCCINATE ER 50 MG PO TB24: 50.0000 mg | ORAL_TABLET | Freq: Every day | ORAL | 1 refills | Status: AC

## 2024-06-03 NOTE — Telephone Encounter (Signed)
 Eliquis  5mg  refill request received. Patient is 73 years old, weight-86.5kg, Crea-0.84 on 09/11/23 via Care Everywhere from [facility], Diagnosis-Afib, and last seen by Dr. Burnard on 10/07/23 and seeing Dr. Francyne on 06/13/24. Dose is appropriate based on dosing criteria. Will send in refill to requested pharmacy.

## 2024-06-13 ENCOUNTER — Encounter: Payer: Self-pay | Admitting: Cardiovascular Disease

## 2024-06-13 ENCOUNTER — Ambulatory Visit: Attending: Cardiovascular Disease | Admitting: Cardiovascular Disease

## 2024-06-13 VITALS — BP 134/72 | HR 65 | Ht 68.0 in | Wt 196.6 lb

## 2024-06-13 DIAGNOSIS — Z79899 Other long term (current) drug therapy: Secondary | ICD-10-CM | POA: Insufficient documentation

## 2024-06-13 DIAGNOSIS — R972 Elevated prostate specific antigen [PSA]: Secondary | ICD-10-CM | POA: Insufficient documentation

## 2024-06-13 DIAGNOSIS — I119 Hypertensive heart disease without heart failure: Secondary | ICD-10-CM | POA: Insufficient documentation

## 2024-06-13 DIAGNOSIS — E7800 Pure hypercholesterolemia, unspecified: Secondary | ICD-10-CM | POA: Diagnosis present

## 2024-06-13 DIAGNOSIS — I422 Other hypertrophic cardiomyopathy: Secondary | ICD-10-CM | POA: Diagnosis present

## 2024-06-13 DIAGNOSIS — Z125 Encounter for screening for malignant neoplasm of prostate: Secondary | ICD-10-CM | POA: Diagnosis present

## 2024-06-13 DIAGNOSIS — I77810 Thoracic aortic ectasia: Secondary | ICD-10-CM | POA: Insufficient documentation

## 2024-06-13 DIAGNOSIS — I4819 Other persistent atrial fibrillation: Secondary | ICD-10-CM | POA: Diagnosis not present

## 2024-06-13 DIAGNOSIS — D6869 Other thrombophilia: Secondary | ICD-10-CM | POA: Diagnosis present

## 2024-06-13 DIAGNOSIS — E785 Hyperlipidemia, unspecified: Secondary | ICD-10-CM

## 2024-06-13 DIAGNOSIS — I10 Essential (primary) hypertension: Secondary | ICD-10-CM | POA: Diagnosis present

## 2024-06-13 DIAGNOSIS — Z5181 Encounter for therapeutic drug level monitoring: Secondary | ICD-10-CM

## 2024-06-13 MED ORDER — RIVAROXABAN 20 MG PO TABS: 20.0000 mg | ORAL_TABLET | Freq: Every day | ORAL | 3 refills | Status: AC

## 2024-06-13 MED ORDER — ATORVASTATIN CALCIUM 40 MG PO TABS: ORAL_TABLET | ORAL | 3 refills | Status: AC

## 2024-06-13 NOTE — Patient Instructions (Addendum)
 Medication Instructions:  Stop taking Eliquis  Start Xarelto 20 mg every evening with a meal *If you need a refill on your cardiac medications before your next appointment, please call your pharmacy*  Lab Work: Lipid panel, CMP, CBC, TSH, PSA If you have labs (blood work) drawn today and your tests are completely normal, you will receive your results only by: MyChart Message (if you have MyChart) OR A paper copy in the mail If you have any lab test that is abnormal or we need to change your treatment, we will call you to review the results.  Testing/Procedures: None ordered  Follow-Up: At [facility], you and your health needs are our priority.  As part of our continuing mission to provide you with exceptional heart care, our providers are all part of one team.  This team includes your primary Cardiologist (physician) and Advanced Practice Providers or APPs (Physician Assistants and Nurse Practitioners) who all work together to provide you with the care you need, when you need it.  Your next appointment:   1 year(s)  Provider:   Dr Francyne  We recommend signing up for the patient portal called MyChart.  Sign up information is provided on this After Visit Summary.  MyChart is used to connect with patients for Virtual Visits (Telemedicine).  Patients are able to view lab/test results, encounter notes, upcoming appointments, etc.  Non-urgent messages can be sent to your provider as well.   To learn more about what you can do with MyChart, go to [email or web address].

## 2024-06-14 ENCOUNTER — Ambulatory Visit: Payer: Self-pay | Admitting: Cardiovascular Disease

## 2024-06-14 LAB — COMPREHENSIVE METABOLIC PANEL WITH GFR
ALT: 23 IU/L (ref 0–44)
AST: 25 IU/L (ref 0–40)
Albumin: 4.3 g/dL (ref 3.8–4.8)
Alkaline Phosphatase: 57 IU/L (ref 47–123)
BUN/Creatinine Ratio: 10 (ref 10–24)
BUN: 10 mg/dL (ref 8–27)
Bilirubin Total: 0.7 mg/dL (ref 0.0–1.2)
CO2: 22 mmol/L (ref 20–29)
Calcium: 9.2 mg/dL (ref 8.6–10.2)
Chloride: 102 mmol/L (ref 96–106)
Creatinine, Ser: 0.99 mg/dL (ref 0.76–1.27)
Globulin, Total: 2.3 g/dL (ref 1.5–4.5)
Glucose: 89 mg/dL (ref 70–99)
Potassium: 4.3 mmol/L (ref 3.5–5.2)
Sodium: 141 mmol/L (ref 134–144)
Total Protein: 6.6 g/dL (ref 6.0–8.5)
eGFR: 79 mL/min/1.73 (ref >59)

## 2024-06-14 LAB — LIPID PANEL
Chol/HDL Ratio: 2.4 ratio (ref 0.0–5.0)
Cholesterol, Total: 165 mg/dL (ref 100–199)
HDL: 68 mg/dL (ref >39)
LDL Chol Calc (NIH): 76 mg/dL (ref 0–99)
Triglycerides: 118 mg/dL (ref 0–149)
VLDL Cholesterol Cal: 21 mg/dL (ref 5–40)

## 2024-06-14 LAB — TSH: TSH: 1.47 u[IU]/mL (ref 0.450–4.500)

## 2024-06-14 LAB — CBC
Hematocrit: 45.9 % (ref 37.5–51.0)
Hemoglobin: 15.3 g/dL (ref 13.0–17.7)
MCH: 31.7 pg (ref 26.6–33.0)
MCHC: 33.3 g/dL (ref 31.5–35.7)
MCV: 95 fL (ref 79–97)
Platelets: 200 x10E3/uL (ref 150–450)
RBC: 4.82 x10E6/uL (ref 4.14–5.80)
RDW: 12.8 % (ref 11.6–15.4)
WBC: 7.2 x10E3/uL (ref 3.4–10.8)

## 2024-06-14 LAB — PSA: Prostate Specific Ag, Serum: 1.1 ng/mL (ref 0.0–4.0)

## 2024-06-15 NOTE — Progress Notes (Signed)
 Cardiology Office Note   Date:  06/15/2024  ID:  Anthony Orr 1951/04/08, MRN [id number] PCP: Siganporia, Anthony Orr  [facility] Providers Cardiologist:  Anthony Balding, MD     History of Present Illness Anthony Orr is a 73 y.o. 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  He feels great and is much more active than the average for man his age.  He recovered very well from right total knee replacement with Dr. Velinda Orr in April of this year.  He has problems with his left knee as well and that is giving him more trouble than the right side.  He is trying to delay another knee surgery replacement as much as he can.  He has been playing tennis, primarily doubles 2 or 3 times a week.  [place] there.  He plays golf frequently.  He is planning to return to skiing and has 3 trips planned this winter (to [place] for Christmas where his daughter lives, to [address] and [address]).  He has not had any falls, serious injuries or bleeding problems.  Is not have any chest pain or shortness of breath with intense activity.  He denies dizziness, palpitations or syncope.  No edema and no claudication.  When he had persistent atrial fibrillation in October 2024 he was unaware of palpitations and the arrhythmia was discovered during a routine office visit.  At that time he had some edema of the lower extremities and briefly took furosemide , but he did not have shortness of breath.  His rate was well-controlled since he was already taking beta-blockers, following an episode of wide-complex tachycardia during stress testing 20 years earlier.  Overall duration was unknown.  He  underwent successful electrical cardioversion after being on anticoagulants for a month.  He has not had any detected recurrence of atrial fibrillation since November 2024.   He always wears his smart watch during the daytime and this has not shown any recurrence of atrial fibrillation.  He often forgets to take the Eliquis  twice daily.  We discussed switching to a once daily alternative Xarelto.  Studies Reviewed EKG Interpretation Date/Time:  Monday June 13 2024 08:42:08 EST Ventricular Rate:  65 PR Interval:  195 QRS Duration:  92 QT Interval:  404 QTC Calculation: 420 R Axis:   4  Text Interpretation: Normal sinus rhythm When compared with ECG of 07-Oct-2023 08:48, PR interval is shorter Confirmed by Anthony Orr [id number]) on 06/13/2024 8:51:45 AM    Echocardiogram October 2024  1. Left ventricular ejection fraction, by estimation, is 55 to 60%. Left  ventricular ejection fraction by 3D volume is 57 %. The left ventricle has  normal function. The left ventricle has no regional wall motion  abnormalities. There is moderate left  ventricular hypertrophy. Left ventricular diastolic function could not be  evaluated.   2. Right ventricular systolic function is normal. The right ventricular  size is normal. Tricuspid regurgitation signal is inadequate for assessing  PA pressure.   3. Left atrial size was mildly dilated.   4. The mitral valve is abnormal. Mild mitral valve regurgitation.   5. The aortic valve is tricuspid. Aortic valve regurgitation is not  visualized.   6.  Aortic dilatation noted. There is mild dilatation of the ascending  aorta, measuring 41 mm.   7. The inferior vena cava is dilated in size with >50% respiratory  variability, suggesting right atrial pressure of 8 mmHg.    Risk Assessment/Calculations  CHA2DS2-VASc Score = 3   This indicates a 3.2% annual risk of stroke. The patient's score is based upon: CHF History: 0 HTN History: 1 Diabetes History:  0 Stroke History: 0 Vascular Disease History: 0 Age Score: 2 Gender Score: 0         Physical Exam VS:  BP 134/72 (BP Location: Left Arm, Patient Position: Sitting, Cuff Size: Normal)   Pulse 65   Ht 5' 8 (1.727 m)   Wt 196 lb 9.6 oz (89.2 kg)   SpO2 96%   BMI 29.89 kg/m        Wt Readings from Last 3 Encounters:  06/13/24 196 lb 9.6 oz (89.2 kg)  10/07/23 190 lb 9.6 oz (86.5 kg)  06/03/23 193 lb 3.2 oz (87.6 kg)    GEN: Well nourished, well developed in no acute distress NECK: No JVD; No carotid bruits CARDIAC: RRR, no murmurs, rubs, gallops RESPIRATORY:  Clear to auscultation without rales, wheezing or rhonchi  ABDOMEN: Soft, non-tender, non-distended EXTREMITIES:  No edema; No deformity   ASSESSMENT AND PLAN AFib: Very low prevalence.  On beta-blockers and anticoagulants.  He was unaware of palpitations.  He should continue wearing his smart watch every day. Anticoagulation: Relatively low embolic risk (CHA2DS2-VASc  3) so recommend continuing anticoagulation, but okay to temporarily interrupt the anticoagulant when there is high risk of bleeding or injury, especially since he is very conscientious and wearing his smart watch which would alert us  for recurrent atrial fibrillation.  He prefers a once daily anticoagulant and will switch to Xarelto, which should provide better compliance.  Did review the fact that Xarelto has a somewhat higher risk of GI bleeding, but this has never been a problem for Anthony Orr.  Pointed out that Xarelto needs to be taken with or immediately following a meal. HLP: Lipid parameters in target range on atorvastatin .  Continue same dose. HTN: Controlled on amlodipine  and metoprolol . 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  This could be secondary to hypertension.  The most recent echocardiogram was done during atrial fibrillation so diastolic function could not be accurately assessed.  Remote history of brief  wide-complex tachycardia during stress testing in 2002.  Previous echocardiograms have reportedly shown some subtle signs of possible cardiomyopathy with hyperdynamic left ventricular function, mid cavity obliteration and grade 1 diastolic dysfunction, but these are not available for direct review.  He has never had symptomatic ventricular arrhythmia, syncope or heart failure (he did have some ankle swelling when he was in persistent atrial fibrillation and briefly took furosemide , but this may have also been related to calcium  channel blocker therapy).  On beta-blockers chronically. Dilated ascending aorta: Very mild, estimated at 41 mm by echo.  The aortic valve is trileaflet.  He has not had any CT imaging.  Will repeat echocardiogram in the next 1-2 years and if there is any sign of further aortic root enlargement we will plan a CT.  He is due for his follow-up visit with his PCP and has not had labs in a while.  He requests that we check his PSA as well when we check his other routine labs, necessary for monitoring of his treatment with anticoagulants and statins.  Dispo: Stop Eliquis  and start Xarelto 20 mg once daily, taken with a meal.  Follow-up in 1 year.  Signed, Anthony Balding, MD
